# Patient Record
Sex: Female | Born: 1975 | Race: White | Hispanic: No | Marital: Married | Smoking: Former smoker
Health system: Southern US, Community
[De-identification: ages and names within clinical notes are randomized; demographics above are authoritative.]

## PROBLEM LIST (undated history)

## (undated) DIAGNOSIS — K921 Melena: Secondary | ICD-10-CM

## (undated) DIAGNOSIS — R51 Headache: Secondary | ICD-10-CM

## (undated) DIAGNOSIS — J302 Other seasonal allergic rhinitis: Secondary | ICD-10-CM

## (undated) DIAGNOSIS — E669 Obesity, unspecified: Secondary | ICD-10-CM

## (undated) DIAGNOSIS — D649 Anemia, unspecified: Secondary | ICD-10-CM

## (undated) DIAGNOSIS — B019 Varicella without complication: Secondary | ICD-10-CM

## (undated) DIAGNOSIS — S92919A Unspecified fracture of unspecified toe(s), initial encounter for closed fracture: Secondary | ICD-10-CM

## (undated) DIAGNOSIS — I10 Essential (primary) hypertension: Secondary | ICD-10-CM

## (undated) DIAGNOSIS — K5792 Diverticulitis of intestine, part unspecified, without perforation or abscess without bleeding: Secondary | ICD-10-CM

## (undated) DIAGNOSIS — K635 Polyp of colon: Secondary | ICD-10-CM

## (undated) HISTORY — DX: Diverticulitis of intestine, part unspecified, without perforation or abscess without bleeding: K57.92

## (undated) HISTORY — DX: Melena: K92.1

## (undated) HISTORY — DX: Varicella without complication: B01.9

## (undated) HISTORY — DX: Obesity, unspecified: E66.9

## (undated) HISTORY — DX: Polyp of colon: K63.5

## (undated) HISTORY — DX: Anemia, unspecified: D64.9

## (undated) HISTORY — DX: Essential (primary) hypertension: I10

---

## 2010-12-04 ENCOUNTER — Ambulatory Visit: Payer: Self-pay | Admitting: Family Medicine

## 2010-12-04 ENCOUNTER — Encounter: Payer: Self-pay | Admitting: Family Medicine

## 2010-12-04 ENCOUNTER — Ambulatory Visit (HOSPITAL_BASED_OUTPATIENT_CLINIC_OR_DEPARTMENT_OTHER)
Admission: RE | Admit: 2010-12-04 | Discharge: 2010-12-04 | Payer: Self-pay | Source: Home / Self Care | Attending: Family Medicine | Admitting: Family Medicine

## 2010-12-04 DIAGNOSIS — R109 Unspecified abdominal pain: Secondary | ICD-10-CM | POA: Insufficient documentation

## 2010-12-04 LAB — CONVERTED CEMR LAB
Bilirubin Urine: NEGATIVE
Glucose, Urine, Semiquant: NEGATIVE
Ketones, urine, test strip: NEGATIVE
Nitrite: NEGATIVE
Protein, U semiquant: NEGATIVE
Specific Gravity, Urine: 1.015
Urobilinogen, UA: 0.2
WBC Urine, dipstick: NEGATIVE
pH: 6

## 2010-12-08 ENCOUNTER — Emergency Department (HOSPITAL_BASED_OUTPATIENT_CLINIC_OR_DEPARTMENT_OTHER)
Admission: EM | Admit: 2010-12-08 | Discharge: 2010-12-08 | Payer: Self-pay | Source: Home / Self Care | Admitting: Emergency Medicine

## 2010-12-08 ENCOUNTER — Telehealth (INDEPENDENT_AMBULATORY_CARE_PROVIDER_SITE_OTHER): Payer: Self-pay | Admitting: *Deleted

## 2010-12-08 LAB — CONVERTED CEMR LAB
ALT: 19 units/L (ref 0–35)
AST: 16 units/L (ref 0–37)
Albumin: 4.6 g/dL (ref 3.5–5.2)
Alkaline Phosphatase: 68 units/L (ref 39–117)
BUN: 9 mg/dL (ref 6–23)
Basophils Absolute: 0 10*3/uL (ref 0.0–0.1)
Basophils Relative: 0 % (ref 0–1)
Bilirubin, Direct: 0.1 mg/dL (ref 0.0–0.3)
CO2: 23 meq/L (ref 19–32)
Calcium: 9.1 mg/dL (ref 8.4–10.5)
Chloride: 104 meq/L (ref 96–112)
Creatinine, Ser: 0.77 mg/dL (ref 0.40–1.20)
Eosinophils Absolute: 0.1 10*3/uL (ref 0.0–0.7)
Eosinophils Relative: 1 % (ref 0–5)
Glucose, Bld: 81 mg/dL (ref 70–99)
HCT: 39.5 % (ref 36.0–46.0)
Hemoglobin: 13 g/dL (ref 12.0–15.0)
Indirect Bilirubin: 0.5 mg/dL (ref 0.0–0.9)
Lymphocytes Relative: 18 % (ref 12–46)
Lymphs Abs: 2.6 10*3/uL (ref 0.7–4.0)
MCHC: 32.9 g/dL (ref 30.0–36.0)
MCV: 82 fL (ref 78.0–100.0)
Monocytes Absolute: 0.7 10*3/uL (ref 0.1–1.0)
Monocytes Relative: 5 % (ref 3–12)
Neutro Abs: 11 10*3/uL — ABNORMAL HIGH (ref 1.7–7.7)
Neutrophils Relative %: 76 % (ref 43–77)
Platelets: 284 10*3/uL (ref 150–400)
Potassium: 3.7 meq/L (ref 3.5–5.3)
RBC: 4.82 M/uL (ref 3.87–5.11)
RDW: 13.6 % (ref 11.5–15.5)
Sed Rate: 7 mm/hr (ref 0–22)
Sodium: 140 meq/L (ref 135–145)
Total Bilirubin: 0.6 mg/dL (ref 0.3–1.2)
Total Protein: 7.1 g/dL (ref 6.0–8.3)
WBC: 14.4 10*3/uL — ABNORMAL HIGH (ref 4.0–10.5)

## 2010-12-13 HISTORY — PX: LAPAROSCOPIC CHOLECYSTECTOMY: SUR755

## 2010-12-15 ENCOUNTER — Telehealth: Payer: Self-pay | Admitting: Family Medicine

## 2010-12-15 ENCOUNTER — Emergency Department (HOSPITAL_BASED_OUTPATIENT_CLINIC_OR_DEPARTMENT_OTHER)
Admission: EM | Admit: 2010-12-15 | Discharge: 2010-12-15 | Payer: Self-pay | Source: Home / Self Care | Admitting: Emergency Medicine

## 2010-12-16 ENCOUNTER — Encounter: Payer: Self-pay | Admitting: Family Medicine

## 2010-12-16 ENCOUNTER — Ambulatory Visit: Admit: 2010-12-16 | Payer: Self-pay | Admitting: Family Medicine

## 2010-12-18 ENCOUNTER — Encounter (INDEPENDENT_AMBULATORY_CARE_PROVIDER_SITE_OTHER): Payer: Self-pay | Admitting: *Deleted

## 2010-12-18 ENCOUNTER — Encounter: Payer: Self-pay | Admitting: Family Medicine

## 2010-12-23 LAB — HM COLONOSCOPY

## 2011-01-14 ENCOUNTER — Telehealth: Payer: Self-pay | Admitting: Internal Medicine

## 2011-01-14 NOTE — Telephone Encounter (Signed)
fine

## 2011-01-14 NOTE — Progress Notes (Signed)
Summary: still no better  Phone Note Call from Patient Call back at [phone]   Caller: Patient Summary of Call: Pt still c/o dizziness, nausea with some abdominal pain and diarrhea follow with meals. Pt states that she only able to drink liquid due to all food causing extreme abdominal pain and diarrhea. Pt has pending OV with you tomorrow but would like to know what she can do in mean time. Pt advise UC/ED but Pt refused stating that she does not want to continue going to UC/ED. Pls advise.........Marland KitchenFelecia Deloach CMA  December 15, 2010 12:56 PM   Follow-up for Phone Call        she may need hospital admission given her abd pain and diarrhea.  i would recommend she go to ER b/c when we last spoke w/ her she was feeling better on liquid diet.  needs GI appt ASAP- has appt pending w/ [facility] but not until 1/23.  please see if this can be moved up. Follow-up by: Neena Rhymes MD,  December 15, 2010 1:02 PM  Additional Follow-up for Phone Call Additional follow up Details #1::        Pt aware but still reluctant to go to ED. Pt states she will see how she feels and if it gets any worse she will go to ED otherwise she will be in tomorrow for OV. Pt also aware that we are working to get a sooner appt...Marland KitchenMarland KitchenFelecia Deloach CMA  December 15, 2010 4:50 PM     Additional Follow-up for Phone Call Additional follow up Details #2::    noted. Follow-up by: Neena Rhymes MD,  December 15, 2010 4:53 PM

## 2011-01-14 NOTE — Telephone Encounter (Signed)
I rcvd a call from this pt. She is currently a pt of Neena Rhymes, MD at [facility]. Pt is interesting in transferring to our practice, since it is closer to her work. Pls advise.

## 2011-01-14 NOTE — Assessment & Plan Note (Signed)
Summary: NEW EST- STOMACH PAIN    Vital Signs:  Patient profile:   35 year old female Height:      66.50 inches Weight:      199 pounds BMI:     31.75 O2 Sat:      98 % on Room air Temp:     98.5 degrees F oral Pulse rate:   88 / minute BP sitting:   130 / 80  (left arm)  Vitals Entered By: Doristine Devoid CMA (December 04, 2010 1:17 PM)  O2 Flow:  Room air CC: stomach pain along w/ NVD, RLQ and back pain along w/ bloody stool    History of Present Illness: 35 yo woman here today to establish care.  previous MD- [facility] in [place].  abd pain- sxs started in Sept.  alternating constipation and diarrhea.  diarrhea will occur in the afternoon and 'last for hours'.  over the last few days pt has developed HA, severe GERD, diarrhea has increased in frequency (6 episodes today, almost pure water w/ blood).  sharp pain in R flank.  + nausea, no vomiting.  not eating or drinking due to fear of vomiting.  + anorexia.  Preventive Screening-Counseling & Management  Alcohol-Tobacco     Alcohol drinks/day: <1     Smoking Status: never      Drug Use:  never.    Current Medications (verified): 1)  Diovan Hct .... Dose Unknown- Takes Daily  Allergies (verified): No Known Drug Allergies  Past History:  Past Medical History: Hypertension  Past Surgical History: Caesarean section  Family History: CAD-no HTN-no DM-no STROKE-grandfather COLON CA-no BREAST CA-no CROHN'S-father LUPUS-father  Social History: twin girls married works at [employer] Status:  never Drug Use:  never  Review of Systems       The patient complains of anorexia, headaches, abdominal pain, hematochezia, severe indigestion/heartburn, and muscle weakness.  The patient denies fever, weight loss, weight gain, syncope, melena, and hematuria.    Physical Exam  General:  pale, tired appearing Head:  NCAT Mouth:  MMM Neck:  No deformities, masses, or tenderness noted. Lungs:   Normal respiratory effort, chest expands symmetrically. Lungs are clear to auscultation, no crackles or wheezes. Heart:  reg S1/S2, no M/R/G Abdomen:  soft, + BSx4, nondistended, + RLQ pain and R CVA tenderness.  no rebound or guarding Rectal:  external hemorrhoid, heme (-) stool Pulses:  +2 radial, DP/PT Extremities:  no C/C/E   Impression & Recommendations:  Problem # 1:  ABDOMINAL PAIN (ICD-789.00) Assessment New pt's differential dx is broad- appendicitis, kidney stone, diverticulitis, Crohn's.  given acute worsening of ongoing process will need CT scan.  draw labs when IV is placed for CT.  start phenergan as needed for nausea.  refer to GI.  determine rest of plan based on CT results.  CT shows diffuse colitis- can't r/o C diff or Crohn's.  start Flagyl.  discussed w/ pt at CT, instructions reviewed multiple times and told pt to f/u at ER if sxs worsen or persist.  Pt expresses understanding and is in agreement w/ this plan. Orders: Venipuncture (04540) TLB-BMP (Basic Metabolic Panel-BMET) (80048-METABOL) TLB-CBC Platelet - w/Differential (85025-CBCD) TLB-Hepatic/Liver Function Pnl (80076-HEPATIC) TLB-Sedimentation Rate (ESR) (85652-ESR) Radiology Referral (Radiology) Gastroenterology Referral (GI) UA Dipstick w/o Micro (manual) (98119)  Complete Medication List: 1)  Diovan Hct  .... Dose unknown- takes daily 2)  Promethazine Hcl 25 Mg Tabs (Promethazine hcl) .Marland Kitchen.. 1 tab by mouth q6 as needed for nausea  3)  Metronidazole 500 Mg Tabs (Metronidazole) .Marland Kitchen.. 1 tab by mouth three times a day x10 days.  take w/ food.  Patient Instructions: 1)  We'll notify you of your lab results 2)  Go to [facility] on [address] for your [address] scan- we'll notify you of your results and decide on additional treatment at that time 3)  Take the Promethazine as needed for nausea 4)  Call with any questions or concerns 5)  If your symptoms worsen- please go to the ER 6)  Hang in there! 7)  Happy  Holidays! Prescriptions: METRONIDAZOLE 500 MG  TABS (METRONIDAZOLE) 1 tab by mouth three times a day x10 days.  take w/ food.  #30 x 0   Entered and Authorized by:   Neena Rhymes MD   Signed by:   Neena Rhymes MD on 12/04/2010   Method used:   Electronically to        [facility] [address] (retail)       [address]       [place], [place]  [address]       Ph: [phone]       Fax: [phone]   RxID:   [id number] PROMETHAZINE HCL 25 MG  TABS (PROMETHAZINE HCL) 1 tab by mouth Q6 as needed for nausea  #30 x 0   Entered and Authorized by:   Neena Rhymes MD   Signed by:   Neena Rhymes MD on 12/04/2010   Method used:   Print then Give to Patient   RxID:   [id number]    Orders Added: 1)  Venipuncture [23762] 2)  TLB-BMP (Basic Metabolic Panel-BMET) [80048-METABOL] 3)  TLB-CBC Platelet - w/Differential [85025-CBCD] 4)  TLB-Hepatic/Liver Function Pnl [80076-HEPATIC] 5)  TLB-Sedimentation Rate (ESR) [85652-ESR] 6)  Radiology Referral [Radiology] 7)  Gastroenterology Referral [GI] 8)  New Patient Level III [99203] 9)  UA Dipstick w/o Micro (manual) [81002]    Laboratory Results   Urine Tests    Routine Urinalysis   Glucose: negative   (Normal Range: Negative) Bilirubin: negative   (Normal Range: Negative) Ketone: negative   (Normal Range: Negative) Spec. Gravity: 1.015   (Normal Range: 1.003-1.035) Blood: small   (Normal Range: Negative) pH: 6.0   (Normal Range: 5.0-8.0) Protein: negative   (Normal Range: Negative) Urobilinogen: 0.2   (Normal Range: 0-1) Nitrite: negative   (Normal Range: Negative) Leukocyte Esterace: negative   (Normal Range: Negative)

## 2011-01-14 NOTE — Progress Notes (Signed)
Summary: Still having Discomfort  Phone Note Call from Patient Call back at [phone] cell   Caller: Patient Call For: Neena Rhymes MD Details for Reason: Still in pain Summary of Call: Call from patient and she stated she was still in pain with NVD, stated she was burping alot and does not feel like the meds are working.Marland KitchenMarland KitchenMarland KitchenI advised the patient we were working on the referral, advised continue to drink fluids and if the pain or any symptoms above get worst go to the hospt. Pt voiced understanding. Advised Renee of the call and she stated she would work on getting the patient and appt today. C/b # [id number]...Marland KitchenMarland KitchenMarland Kitchen Almeta Monas CMA Duncan Dull)  December 08, 2010 8:28 AM

## 2011-01-14 NOTE — Progress Notes (Signed)
Summary: GI REFERRAL---ER call-check on pt tomorrow  Phone Note Outgoing Call   Call placed by: Magdalen Spatz [facility],  December 08, 2010 8:51 AM Call placed to: Specialist Summary of Call: I CALLED [facility] TO SCH'D PT FOR ASAP APPT.  THE TRIAGE NURSE TOOK DOWN PATIENT SYMPTOMS, LABS RESULTS, CT RESULTS & SPOKE WITH GI PHSYICIAN WHO STATES PATIENT SHOULD REPORT TO HER LOCAL ER FOR POSSIBLE ADMISSION.  I HAVE CALLED & INFORMED PT OF ABOVE, PATIENT AGREES TO ER & STATES SHE WILL GO TO [facility] ER IN [place].  Initial call taken by: Magdalen Spatz [facility],  December 08, 2010 8:51 AM  Follow-up for Phone Call        patient went to the ER, the ER physician Dr. Rosalia Hammers called  me. Pt  was a slightly bloated, slightly dehydrated, got  IV fluids.  All labs from today  came back normal, WBC 8.0. Plain x-ray showed no obstruction. Is  the ER doctor opinion that she can go home and the patient feels the same way. plan---- Check on her tomorrow, visit with one of [place] this week if no better. continue with Flagyl ( since she is getting better, we decided not to add Cipro) Be sure that she has appropriate GI followup within the next few days Jose E. Paz MD  December 08, 2010 1:21 PM   Additional Follow-up for Phone Call Additional follow up Details #1::        agree.  will call pt 12/28 and see how pt is feeling. Additional Follow-up by: Neena Rhymes MD,  December 08, 2010 3:26 PM    Additional Follow-up for Phone Call Additional follow up Details #2::    PATIENT'S APPOINTMENT IS 01-04-2011 ARRIVE 10:45AM W/PA-JANE LANGFITT OF [facility] (1ST AVAIL).  I S/W PATIENT SHE IS AWARE AND FINE WITH THE DATE, BUT IS THIS DATE OK WITH DR. TABORI? Magdalen Spatz [facility]  December 09, 2010 9:56 AM  spoke w/ patient says she is feeling much better and has been on liquid diet also still taking flaygl has appt w/ GI but will call if symptoms worsen to see if we can get sooner appt. .......Marland KitchenDoristine Devoid CMA   December 09, 2010 10:45 AM   New/Updated Medications: DIOVAN HCT 80-12.5 MG TABS (VALSARTAN-HYDROCHLOROTHIAZIDE) take one tablet daily And

## 2011-01-14 NOTE — Consult Note (Signed)
Summary: [facility]  [facility]   Imported By: Lanelle Bal 12/25/2010 13:45:51  _____________________________________________________________________  External Attachment:    Type:   Image     Comment:   External Document

## 2011-01-14 NOTE — Miscellaneous (Signed)
  Clinical Lists Changes  Observations: Added new observation of COLONRECACT: few scattered diverticula 1 sessile polyp removed 5-10 years pending path (12/18/2009 16:35)        Colonoscopy  Procedure date:  12/18/2009  Comments:      few scattered diverticula 1 sessile polyp removed 5-10 years pending path

## 2011-01-14 NOTE — Procedures (Signed)
Summary: Colonoscopy/[facility]  Colonoscopy/[facility]   Imported By: Lanelle Bal 12/25/2010 13:44:21  _____________________________________________________________________  External Attachment:    Type:   Image     Comment:   External Document

## 2011-01-14 NOTE — Telephone Encounter (Signed)
----   01/14/2011 1:57 PM, Neena Rhymes MD wrote: this would be fine

## 2011-01-20 ENCOUNTER — Ambulatory Visit (INDEPENDENT_AMBULATORY_CARE_PROVIDER_SITE_OTHER): Payer: BC Managed Care – PPO | Admitting: Family Medicine

## 2011-01-20 ENCOUNTER — Encounter: Payer: Self-pay | Admitting: Internal Medicine

## 2011-01-20 ENCOUNTER — Encounter: Payer: Self-pay | Admitting: Family Medicine

## 2011-01-20 VITALS — BP 130/80 | HR 80 | Temp 98.6°F | Resp 12 | Ht 65.25 in | Wt 194.0 lb

## 2011-01-20 DIAGNOSIS — J029 Acute pharyngitis, unspecified: Secondary | ICD-10-CM

## 2011-01-20 LAB — POCT RAPID STREP A (OFFICE): Rapid Strep A Screen: NEGATIVE

## 2011-01-20 NOTE — Progress Notes (Signed)
  Subjective:    Patient ID: [name], female    DOB: 1976/08/24, 35 y.o.   MRN: [id number]  HPI  New patient seen with acute issue of onset couple days ago sore throat. Daughter just diagnosed with strep throat yesterday. Patient initially developed sore throat followed by some nasal congestion rare cough. No fever but occasional mild chills. Denies any nausea, vomiting, diarrhea , or any rash. Has taken Tylenol with mild improvement.    patient scheduled for complete physical this Friday with Dr. Rodena Medin.   She relates about 3 week history of some daily constant right temporal headache. Mild to moderate severity. No visual changes. Sometimes worse after stooping over. No progressive pattern. No focal neurologic symptoms. No recent head injury.   Her past medical history is reviewed. She has reported history of diverticulitis , remote history of anemia comment benign colon polyps, and hypertension. Takes Diovan HCTZ 80/12.5 mg one daily. Had recent colonoscopy last month. Nonsmoker. Rare alcohol use.   Review of Systems  as per history of present illness    Objective:   Physical Exam  patient is alert and healthy in appearance  Oropharynx reveals a couple small  Abscess ulcers. No exudate.  neck supple with no adenopathy  Chest is clear to auscultation  Heart regular rhythm and rate with no murmur  Skin exam no rash       Assessment & Plan:   probable viral pharyngitis. Rapid strep negative. Treat symptomatically. Atypical headaches.  She will discuss with Dr Rodena Medin at [facility] Friday.

## 2011-01-20 NOTE — Patient Instructions (Signed)
Viral Pharyngitis  Viral pharyngitis is a sore throat caused by a cold virus. This is not a strep throat. This does not require treatment by an antibiotic (medications that kill germs). It will get better on its own. Antibiotics generally will not help unless this condition is followed by a bacterial (germ) infection.  HOME CARE INSTRUCTIONS   Drink or give your child plenty of fluids. Soft, cold foods such as ice cream, popsicles, or jello may be used.    Gargle with warm salt water (one teaspoon salt per quart of water).    If over age 7, throat lozenges may be used safely.    Only take over-the-counter or prescription medicines for pain, discomfort, or fever as directed by your caregiver. DO NOT USE ASPIRIN.   SEEK MEDICAL CARE IF:   You are better in a few days, then become worse.    You have a fever or pain unresponsive to pain medicines.    There are any other changes that concern you.   Diagnostic tests may have been performed. If you have not received results at time of discharge, receive instructions as how to obtain these results.   Document Released: 09/08/2005 Document Re-Released: 05/17/2008  Exit[employer] Patient Information 2011 [employer].

## 2011-01-22 ENCOUNTER — Encounter: Payer: Self-pay | Admitting: Internal Medicine

## 2011-01-22 ENCOUNTER — Ambulatory Visit (INDEPENDENT_AMBULATORY_CARE_PROVIDER_SITE_OTHER): Payer: BC Managed Care – PPO | Admitting: Internal Medicine

## 2011-01-22 DIAGNOSIS — Z13220 Encounter for screening for lipoid disorders: Secondary | ICD-10-CM

## 2011-01-22 DIAGNOSIS — Z79899 Other long term (current) drug therapy: Secondary | ICD-10-CM

## 2011-01-22 DIAGNOSIS — E785 Hyperlipidemia, unspecified: Secondary | ICD-10-CM

## 2011-01-22 DIAGNOSIS — R51 Headache: Secondary | ICD-10-CM

## 2011-01-22 DIAGNOSIS — I10 Essential (primary) hypertension: Secondary | ICD-10-CM

## 2011-01-22 DIAGNOSIS — R109 Unspecified abdominal pain: Secondary | ICD-10-CM

## 2011-01-22 LAB — LDL CHOLESTEROL, DIRECT: Direct LDL: 61.7 mg/dL

## 2011-01-22 LAB — BASIC METABOLIC PANEL
BUN: 10 mg/dL (ref 6–23)
CO2: 29 mEq/L (ref 19–32)
Calcium: 9.1 mg/dL (ref 8.4–10.5)
Chloride: 105 mEq/L (ref 96–112)
Creatinine, Ser: 0.7 mg/dL (ref 0.4–1.2)
GFR: 110.78 mL/min (ref >60.00)
Glucose, Bld: 86 mg/dL (ref 70–99)
Potassium: 3.9 mEq/L (ref 3.5–5.1)
Sodium: 140 mEq/L (ref 135–145)

## 2011-01-22 LAB — CBC WITH DIFFERENTIAL/PLATELET
Basophils Absolute: 0 10*3/uL (ref 0.0–0.1)
Basophils Relative: 0.6 % (ref 0.0–3.0)
Eosinophils Absolute: 0.1 10*3/uL (ref 0.0–0.7)
Eosinophils Relative: 2 % (ref 0.0–5.0)
HCT: 37.3 % (ref 36.0–46.0)
Hemoglobin: 12.8 g/dL (ref 12.0–15.0)
Lymphocytes Relative: 37.5 % (ref 12.0–46.0)
Lymphs Abs: 2.2 10*3/uL (ref 0.7–4.0)
MCHC: 34.4 g/dL (ref 30.0–36.0)
MCV: 81.7 fl (ref 78.0–100.0)
Monocytes Absolute: 0.4 10*3/uL (ref 0.1–1.0)
Monocytes Relative: 6.8 % (ref 3.0–12.0)
Neutro Abs: 3.2 10*3/uL (ref 1.4–7.7)
Neutrophils Relative %: 53.1 % (ref 43.0–77.0)
Platelets: 285 10*3/uL (ref 150.0–400.0)
RBC: 4.57 Mil/uL (ref 3.87–5.11)
RDW: 14.4 % (ref 11.5–14.6)
WBC: 6 10*3/uL (ref 4.5–10.5)

## 2011-01-22 LAB — LIPID PANEL
Cholesterol: 162 mg/dL (ref 0–200)
HDL: 29.3 mg/dL — ABNORMAL LOW (ref >39.00)
Total CHOL/HDL Ratio: 6
Triglycerides: 419 mg/dL — ABNORMAL HIGH (ref 0.0–149.0)
VLDL: 83.8 mg/dL — ABNORMAL HIGH (ref 0.0–40.0)

## 2011-01-22 LAB — TSH: TSH: 1.68 u[IU]/mL (ref 0.35–5.50)

## 2011-01-22 MED ORDER — VALSARTAN-HYDROCHLOROTHIAZIDE 80-12.5 MG PO TABS: 1.0000 | ORAL_TABLET | Freq: Every day | ORAL | Status: DC

## 2011-01-22 NOTE — Progress Notes (Signed)
  Subjective:    Patient ID: [name], female    DOB: January 24, 1976, 35 y.o.   MRN: [id number]  HPI Pt presents to clinic to re-establish primary care and followup HTN. Chart review indicates h/o abd pain, diarrhea s/p CT demonstrating diffuse inflammation. Attempte flagyl with no improvement. Underwent colonoscopy 12/18/10 with single polyp and diverticuli. Was told random bx's nl. Placed on prednisone, changed her diet and states her sx resolved. Does note recent 3wk h/o right temple HA described as dull with intermittent throbbing sensation. Denies numbness, tingling, extremity weakness. Has significant spontaneous improvement over the last several days and wonders if HA was related to recent medications an diet changes. H/o HTN apparently well controlled and tolerates diovan hct without adverse effect. Sees gyn for routine paps and has IUD in place.   Reviewed PMH, medications, allergies, social hx and family hx.    Review of Systems  Constitutional: Negative for fever and chills.  HENT: Positive for congestion and rhinorrhea.   Eyes: Negative for discharge and redness.  Neurological: Positive for headaches. Negative for speech difficulty, weakness and numbness.       Objective:   Physical Exam  Constitutional: She appears well-developed and well-nourished. No distress.  HENT:  Head: Normocephalic.  Right Ear: Tympanic membrane and external ear normal.  Left Ear: Tympanic membrane and external ear normal.  Nose: Nose normal.  Mouth/Throat: Oropharynx is clear and moist.  Eyes: Conjunctivae and EOM are normal. Pupils are equal, round, and reactive to light. Right eye exhibits no discharge. Left eye exhibits no discharge. No scleral icterus.  Neck: Neck supple. No thyromegaly present.  Cardiovascular: Normal rate, regular rhythm and normal heart sounds.  Exam reveals no gallop and no friction rub.   No murmur heard. Pulmonary/Chest: Effort normal and breath sounds normal. No  respiratory distress. She has no wheezes. She has no rales.  Abdominal: Soft. Bowel sounds are normal. She exhibits no distension and no mass. There is no tenderness.  Lymphadenopathy:    She has no cervical adenopathy.  Neurological: She is alert. No cranial nerve deficit. Coordination normal.  Skin: Skin is dry. No rash noted. She is not diaphoretic. No erythema.  Psychiatric: She has a normal mood and affect.          Assessment & Plan:

## 2011-01-26 DIAGNOSIS — R51 Headache: Secondary | ICD-10-CM | POA: Insufficient documentation

## 2011-01-26 DIAGNOSIS — R519 Headache, unspecified: Secondary | ICD-10-CM | POA: Insufficient documentation

## 2011-01-26 NOTE — Assessment & Plan Note (Signed)
Clinically improving and pt feels likely due to finishing medication and change in diet. Followup if no resolution.

## 2011-01-26 NOTE — Assessment & Plan Note (Signed)
Resolved s/p prednisone and dietary changes.

## 2011-01-26 NOTE — Assessment & Plan Note (Signed)
Normotensive control. Continue current medication. Monitor BP as an outpt and followup in clinic as scheduled. Obtain CBC, chem7, lipid and TSH.

## 2011-01-27 ENCOUNTER — Other Ambulatory Visit: Payer: Self-pay | Admitting: Internal Medicine

## 2011-01-27 ENCOUNTER — Telehealth: Payer: Self-pay

## 2011-01-27 DIAGNOSIS — E785 Hyperlipidemia, unspecified: Secondary | ICD-10-CM

## 2011-01-27 NOTE — Telephone Encounter (Signed)
Message copied by Kyung Rudd on Wed Jan 27, 2011  2:44 PM ------      Message from: Letitia Libra, Maisie Fus      Created: Wed Jan 27, 2011  1:28 PM       Labs reviewed. tg elevated 419 and hdl low. Recommend low fat diet, regular aerobic exercise at least 4x/wk. Suggest otc fish oil daily 2 capsules. Can repeat lipid fasting in 3 months. Order in

## 2011-01-27 NOTE — Telephone Encounter (Signed)
Pt aware and verbalized understanding.  

## 2011-01-29 ENCOUNTER — Telehealth: Payer: Self-pay | Admitting: *Deleted

## 2011-01-29 ENCOUNTER — Telehealth: Payer: Self-pay

## 2011-01-29 NOTE — Telephone Encounter (Signed)
Pt. Would like to be referred to a nutrionist.

## 2011-01-29 NOTE — Telephone Encounter (Signed)
Pt would like the referral to the Nutrionist.

## 2011-01-29 NOTE — Telephone Encounter (Signed)
Can make the referral but need a reason and caution her that insurance doesn't always cover nutrition referrals

## 2011-01-29 NOTE — Telephone Encounter (Signed)
Pt wants to talk to someone that can help her understand what foods are good for her. Says she thinks her insurance covers nutritionist for up to 2 yrs but will check to be sure.

## 2011-02-01 ENCOUNTER — Other Ambulatory Visit: Payer: Self-pay | Admitting: Internal Medicine

## 2011-02-01 DIAGNOSIS — I10 Essential (primary) hypertension: Secondary | ICD-10-CM

## 2011-02-04 ENCOUNTER — Other Ambulatory Visit (HOSPITAL_COMMUNITY): Payer: Self-pay | Admitting: Gastroenterology

## 2011-02-04 ENCOUNTER — Encounter: Payer: Self-pay | Admitting: Family Medicine

## 2011-02-04 DIAGNOSIS — R110 Nausea: Secondary | ICD-10-CM

## 2011-02-17 ENCOUNTER — Ambulatory Visit (HOSPITAL_COMMUNITY)
Admission: RE | Admit: 2011-02-17 | Discharge: 2011-02-17 | Disposition: A | Discharge status: Home / Self Care | Payer: BC Managed Care – PPO | Source: Ambulatory Visit | Attending: Gastroenterology | Admitting: Gastroenterology

## 2011-02-17 DIAGNOSIS — R109 Unspecified abdominal pain: Secondary | ICD-10-CM | POA: Insufficient documentation

## 2011-02-17 DIAGNOSIS — R110 Nausea: Secondary | ICD-10-CM | POA: Insufficient documentation

## 2011-02-17 MED ORDER — TECHNETIUM TC 99M MEBROFENIN IV KIT
5.0000 | PACK | Freq: Once | INTRAVENOUS | Status: AC | PRN
  Administered 2011-02-17: 5 via INTRAVENOUS

## 2011-02-19 ENCOUNTER — Ambulatory Visit (HOSPITAL_COMMUNITY)
Admission: RE | Admit: 2011-02-19 | Discharge: 2011-02-19 | Disposition: A | Discharge status: Home / Self Care | Payer: BC Managed Care – PPO | Source: Ambulatory Visit | Attending: General Surgery | Admitting: General Surgery

## 2011-02-19 ENCOUNTER — Other Ambulatory Visit: Payer: Self-pay | Admitting: General Surgery

## 2011-02-19 ENCOUNTER — Other Ambulatory Visit (HOSPITAL_COMMUNITY): Payer: Self-pay | Admitting: General Surgery

## 2011-02-19 ENCOUNTER — Encounter (HOSPITAL_COMMUNITY): Payer: BC Managed Care – PPO

## 2011-02-19 DIAGNOSIS — K828 Other specified diseases of gallbladder: Secondary | ICD-10-CM | POA: Insufficient documentation

## 2011-02-19 DIAGNOSIS — I10 Essential (primary) hypertension: Secondary | ICD-10-CM | POA: Insufficient documentation

## 2011-02-19 DIAGNOSIS — Z01812 Encounter for preprocedural laboratory examination: Secondary | ICD-10-CM | POA: Insufficient documentation

## 2011-02-19 DIAGNOSIS — Z01810 Encounter for preprocedural cardiovascular examination: Secondary | ICD-10-CM | POA: Insufficient documentation

## 2011-02-19 DIAGNOSIS — Z01818 Encounter for other preprocedural examination: Secondary | ICD-10-CM | POA: Insufficient documentation

## 2011-02-19 LAB — CBC
HCT: 41.4 % (ref 36.0–46.0)
Hemoglobin: 13.3 g/dL (ref 12.0–15.0)
MCH: 26.5 pg (ref 26.0–34.0)
MCHC: 32.1 g/dL (ref 30.0–36.0)
MCV: 82.5 fL (ref 78.0–100.0)
Platelets: 270 10*3/uL (ref 150–400)
RBC: 5.02 MIL/uL (ref 3.87–5.11)
RDW: 13.9 % (ref 11.5–15.5)
WBC: 8.8 10*3/uL (ref 4.0–10.5)

## 2011-02-19 LAB — COMPREHENSIVE METABOLIC PANEL
Alkaline Phosphatase: 67 U/L (ref 39–117)
BUN: 11 mg/dL (ref 6–23)
CO2: 27 mEq/L (ref 19–32)
Chloride: 102 mEq/L (ref 96–112)
Creatinine, Ser: 0.8 mg/dL (ref 0.4–1.2)
GFR calc non Af Amer: >60 mL/min (ref >60)
Glucose, Bld: 84 mg/dL (ref 70–99)
Potassium: 3.5 mEq/L (ref 3.5–5.1)
Total Bilirubin: 0.4 mg/dL (ref 0.3–1.2)

## 2011-02-19 LAB — DIFFERENTIAL
Basophils Absolute: 0.1 10*3/uL (ref 0.0–0.1)
Basophils Relative: 1 % (ref 0–1)
Eosinophils Absolute: 0.2 10*3/uL (ref 0.0–0.7)
Eosinophils Relative: 2 % (ref 0–5)
Lymphocytes Relative: 42 % (ref 12–46)
Lymphs Abs: 3.7 10*3/uL (ref 0.7–4.0)
Monocytes Absolute: 0.5 10*3/uL (ref 0.1–1.0)
Monocytes Relative: 5 % (ref 3–12)
Neutro Abs: 4.4 10*3/uL (ref 1.7–7.7)
Neutrophils Relative %: 50 % (ref 43–77)

## 2011-02-19 LAB — HCG, SERUM, QUALITATIVE: Preg, Serum: NEGATIVE

## 2011-02-19 LAB — SURGICAL PCR SCREEN: MRSA, PCR: NEGATIVE

## 2011-02-22 LAB — URINALYSIS, ROUTINE W REFLEX MICROSCOPIC
Bilirubin Urine: NEGATIVE
Bilirubin Urine: NEGATIVE
Glucose, UA: NEGATIVE mg/dL
Hgb urine dipstick: NEGATIVE
Ketones, ur: NEGATIVE mg/dL
Ketones, ur: NEGATIVE mg/dL
Nitrite: NEGATIVE
Nitrite: NEGATIVE
Protein, ur: NEGATIVE mg/dL
Specific Gravity, Urine: 1.005 (ref 1.005–1.030)
Specific Gravity, Urine: 1.011 (ref 1.005–1.030)
Urobilinogen, UA: 0.2 mg/dL (ref 0.0–1.0)
Urobilinogen, UA: 0.2 mg/dL (ref 0.0–1.0)
pH: 6.5 (ref 5.0–8.0)

## 2011-02-22 LAB — CBC
HCT: 39.7 % (ref 36.0–46.0)
Hemoglobin: 12.9 g/dL (ref 12.0–15.0)
Hemoglobin: 13.5 g/dL (ref 12.0–15.0)
MCH: 26.5 pg (ref 26.0–34.0)
MCH: 26.6 pg (ref 26.0–34.0)
MCHC: 34 g/dL (ref 30.0–36.0)
MCV: 78.1 fL (ref 78.0–100.0)
MCV: 78.6 fL (ref 78.0–100.0)
Platelets: 294 10*3/uL (ref 150–400)
Platelets: 312 10*3/uL (ref 150–400)
RBC: 4.87 MIL/uL (ref 3.87–5.11)
RBC: 5.08 MIL/uL (ref 3.87–5.11)
RDW: 13.9 % (ref 11.5–15.5)
WBC: 8.5 10*3/uL (ref 4.0–10.5)
WBC: 8.9 10*3/uL (ref 4.0–10.5)

## 2011-02-22 LAB — COMPREHENSIVE METABOLIC PANEL
ALT: 45 U/L — ABNORMAL HIGH (ref 0–35)
AST: 54 U/L — ABNORMAL HIGH (ref 0–37)
Albumin: 4.5 g/dL (ref 3.5–5.2)
Albumin: 4.9 g/dL (ref 3.5–5.2)
Alkaline Phosphatase: 48 U/L (ref 39–117)
BUN: 10 mg/dL (ref 6–23)
BUN: 9 mg/dL (ref 6–23)
CO2: 25 mEq/L (ref 19–32)
Calcium: 9.3 mg/dL (ref 8.4–10.5)
Calcium: 9.7 mg/dL (ref 8.4–10.5)
Chloride: 102 mEq/L (ref 96–112)
Creatinine, Ser: 0.7 mg/dL (ref 0.4–1.2)
Creatinine, Ser: 0.8 mg/dL (ref 0.4–1.2)
GFR calc Af Amer: >60 mL/min (ref >60)
GFR calc non Af Amer: >60 mL/min (ref >60)
Glucose, Bld: 106 mg/dL — ABNORMAL HIGH (ref 70–99)
Potassium: 4 mEq/L (ref 3.5–5.1)
Potassium: 4.6 mEq/L (ref 3.5–5.1)
Sodium: 141 mEq/L (ref 135–145)
Total Bilirubin: 0.9 mg/dL (ref 0.3–1.2)
Total Protein: 7.6 g/dL (ref 6.0–8.3)
Total Protein: 8.8 g/dL — ABNORMAL HIGH (ref 6.0–8.3)

## 2011-02-22 LAB — URINE MICROSCOPIC-ADD ON

## 2011-02-22 LAB — DIFFERENTIAL
Basophils Absolute: 0.1 10*3/uL (ref 0.0–0.1)
Basophils Relative: 1 % (ref 0–1)
Eosinophils Absolute: 0.3 10*3/uL (ref 0.0–0.7)
Eosinophils Relative: 4 % (ref 0–5)
Lymphocytes Relative: 30 % (ref 12–46)
Lymphocytes Relative: 31 % (ref 12–46)
Lymphs Abs: 2.5 10*3/uL (ref 0.7–4.0)
Lymphs Abs: 2.8 10*3/uL (ref 0.7–4.0)
Monocytes Absolute: 0.6 10*3/uL (ref 0.1–1.0)
Monocytes Absolute: 0.8 10*3/uL (ref 0.1–1.0)
Monocytes Relative: 7 % (ref 3–12)
Monocytes Relative: 8 % (ref 3–12)
Neutro Abs: 5 10*3/uL (ref 1.7–7.7)
Neutro Abs: 5.2 10*3/uL (ref 1.7–7.7)
Neutrophils Relative %: 56 % (ref 43–77)

## 2011-02-22 LAB — PREGNANCY, URINE
Preg Test, Ur: NEGATIVE
Preg Test, Ur: NEGATIVE

## 2011-02-22 LAB — URINE CULTURE
Colony Count: NO GROWTH
Culture  Setup Time: 201201040752
Culture: NO GROWTH

## 2011-02-23 ENCOUNTER — Ambulatory Visit (HOSPITAL_COMMUNITY): Payer: BC Managed Care – PPO

## 2011-02-23 ENCOUNTER — Ambulatory Visit (HOSPITAL_COMMUNITY)
Admission: RE | Admit: 2011-02-23 | Discharge: 2011-02-23 | Disposition: A | Discharge status: Home / Self Care | Payer: BC Managed Care – PPO | Source: Ambulatory Visit | Attending: General Surgery | Admitting: General Surgery

## 2011-02-23 ENCOUNTER — Other Ambulatory Visit: Payer: Self-pay | Admitting: General Surgery

## 2011-02-23 DIAGNOSIS — I10 Essential (primary) hypertension: Secondary | ICD-10-CM | POA: Insufficient documentation

## 2011-02-23 DIAGNOSIS — K811 Chronic cholecystitis: Secondary | ICD-10-CM | POA: Insufficient documentation

## 2011-02-23 DIAGNOSIS — Z6838 Body mass index (BMI) 38.0-38.9, adult: Secondary | ICD-10-CM | POA: Insufficient documentation

## 2011-02-23 DIAGNOSIS — K660 Peritoneal adhesions (postprocedural) (postinfection): Secondary | ICD-10-CM | POA: Insufficient documentation

## 2011-02-23 DIAGNOSIS — F17200 Nicotine dependence, unspecified, uncomplicated: Secondary | ICD-10-CM | POA: Insufficient documentation

## 2011-03-15 NOTE — Op Note (Signed)
[name], [name]               ACCOUNT NO.:  [id number]  MEDICAL RECORD NO.:  [id number]           PATIENT TYPE:  O  LOCATION:  DAYL                         FACILITY:  [facility]  PHYSICIAN:  Anselm Pancoast. Shaivi Rothschild, M.D.DATE OF BIRTH:  21-Mar-1976  DATE OF PROCEDURE:  02/23/2011 DATE OF DISCHARGE:                              OPERATIVE REPORT   REFERRED BY:  Anselmo Rod, MD, [facility]  PREOPERATIVE DIAGNOSIS:  Biliary dyskinesia.  OPERATIONS:  Laparoscopic cholecystectomy, cholangiogram, and also lysis of adhesions below the umbilicus from an old C-section.  ANESTHESIA:  General.  SURGEON:  Anselm Pancoast. Zachery Dakins, MD  ASSISTANT:  Nurse.  HISTORY:  [name] is a 36 year old female who was referred to me by Dr. Charna Elizabeth with symptoms consistent with biliary dyskinesia. She had a pregnancy.  Recently has had episodes of pain that was first diarrhea, then she had a CT that showed some colitis.  The gallbladder looked unremarkable.  She was treated with antibiotics over a period of about 2 to 3 months and her symptoms kind of gradually improved but her pain shifted to the upper abdomen.  She had an ultrasound of the gallbladder, did not show gallstones but then she had a CCK stimulated hepatobiliary scan and this showed a 10% ejection fraction and markedly reproduced her symptoms.  Dr. Loreta Ave referred her to [place] and I saw her in the office on Friday.  She has been on Zofran.  Her C-sections, the last one was in 2006, where she had twins, and she is on blood pressure medicines.  She desires to proceed on with a cholecystectomy and is here for the planned procedure.  We did not repeat lab studies, as they were done on Friday and they were unremarkable.  The patient was taken to the operative suite, given 3 g of Unasyn.  She has got PAS stockings, positioned on the OR table in room 7, and then the abdomen after induction of general anesthesia was prepped with Betadine solution and  draped in a sterile manner.  A small incision was made below the umbilicus.  There is no incision at the umbilicus that I could see, fascia identified down about an inch-and-a half and the adipose tissue picked up between 2 Kochers and a small opening carefully made into the peritoneal cavity.  She has adhesions at the umbilicus and a pursestring suture of 0 Vicryl was placed and Hasson cannula introduced.  The camera was switched and placed in, CO2 was infused and you can see that she has got a distended, kind of tight gallbladder, but I could not see any stones.  The upper 10-mm trocar was placed under direct vision after anesthetized the fascia and two lateral 5-mm trocars were placed at the lateral appropriate positions.  Gallbladder retracted upward and outward.  There were no adhesions in the proximal portion of gallbladder and we made a little opening in the peritoneum at the gallbladder and cystic duct junction, and then I encompassed the right angle or used the right angle to go around the cystic duct at the junction of gallbladder.  I could see the cystic  artery just posterior to this and I freed it up and then I put 2 clips proximal to cystic artery, one distally and then wound clip across the cystic duct and gallbladder junction.  A little opening was made just proximal to this clip.  Cook catheter was introduced.  There was no sludge in the cystic duct and Cook catheter held in place with clip and then a cholangiogram obtained.  There was good prompt filling of the extrahepatic biliary system.  Good flow into the duodenum, about a centimeter and a half in cystic duct.  I then took the catheter out, put 3 clips across the proximal cystic duct, divided it and then divided the cystic artery just distal to the 2 proximal clips.  I then freed up and identified a posterior branch of the cystic artery.  It was likewise doubly clipped proximally and divided and the gallbladder freed  from its bed.  With the gallbladder being tense and manipulation, there was a little spillage of bile but I could not see any stones and then the gallbladder was freed and was placed in the EndoCatch bag.  I reinspected the bed, couple little areas were coagulated for good hemostasis.  I then switched the camera to the upper 10-mm port and switched the little bag that the gallbladder had been placed in, was grabbed and brawled out at the umbilicus.  She has got a dense wall of adhesions just below the umbilicus area, probably at the top part of her C-section incision and I think that some of the bowel was actually flipping around this, as it is real hypertrophied area, and there are some loops of small bowel but there were no small bowel adherent to this area.  I then went ahead and put an additional figure-of-8 at the fascia of the umbilicus, tied them both, then anesthetized the fascia and then using the camera in the upper 10 mm port, divided these adhesions right at the area.  There was good hemostasis.  I did have a little heat on the scissors right on the peritoneum but there appears to be no bleeding, and I do not see any evidence of a hernia in this area at the top part of the incision.  I think by dividing these adhesions that might be given her some of the symptoms and the symptoms that she had described as colitis, probably related somehow now to these adhesions.  The irrigating fluid was aspirated.  I put more __________ in because we had lost a little while to irrigate, everything was clear, and then the upper 10-mm trocar site after other ports had been removed, I did put a figure-of-8 in the fascia at this level, tied it with 0 Vicryl and 4-0 Vicryl on the subcuticular sutures.  The patient tolerated procedure nicely and we will let her decide whether she goes home this afternoon or in the morning __________.     Anselm Pancoast. Zachery Dakins, M.D.     WJW/MEDQ  D:   02/23/2011  T:  02/23/2011  Job:  [id number]  cc:   Anselmo Rod, MD, [facility] Fax: [phone]  Electronically Signed by Consuello Bossier M.D. on 03/15/2011 03:55:40 PM

## 2011-04-30 ENCOUNTER — Other Ambulatory Visit: Payer: BC Managed Care – PPO

## 2011-05-28 ENCOUNTER — Ambulatory Visit (INDEPENDENT_AMBULATORY_CARE_PROVIDER_SITE_OTHER): Payer: BC Managed Care – PPO | Admitting: Internal Medicine

## 2011-05-28 ENCOUNTER — Encounter: Payer: Self-pay | Admitting: Internal Medicine

## 2011-05-28 DIAGNOSIS — H109 Unspecified conjunctivitis: Secondary | ICD-10-CM | POA: Insufficient documentation

## 2011-05-28 MED ORDER — POLYMYXIN B-TRIMETHOPRIM 10000-0.1 UNIT/ML-% OP SOLN: 1.0000 [drp] | OPHTHALMIC | Status: AC

## 2011-05-28 NOTE — Progress Notes (Signed)
  Subjective:    Patient ID: [name], female    DOB: September 21, 1976, 35 y.o.   MRN: [id number]  HPI Pt presents to clinic for evaluation of eye irritation. Notes one day history of left eye discomfort, mucopurulent discharge and erythema. Husband has conjunctivitis and currently being treated with antibiotics. No change in vision no fever or chills. No exacerbating or alleviating factors. No other complaints.  Reviewed past medical history, medications and allergies.  Review of Systems see hpi     Objective:   Physical Exam  Nursing note and vitals reviewed. Constitutional: She appears well-developed and well-nourished. No distress.  HENT:  Head: Normocephalic and atraumatic.  Right Ear: External ear normal.  Left Ear: External ear normal.  Nose: Nose normal.  Eyes: EOM are normal. Right eye exhibits no discharge. Left eye exhibits discharge. Right conjunctiva is not injected. Right conjunctiva has no hemorrhage. Left conjunctiva is injected. Left conjunctiva has no hemorrhage. No scleral icterus.  Neurological: She is alert.  Skin: Skin is warm and dry. She is not diaphoretic.  Psychiatric: She has a normal mood and affect.          Assessment & Plan:

## 2011-05-28 NOTE — Assessment & Plan Note (Signed)
Begin antibiotic drops.Followup if no improvement or worsening.

## 2011-07-16 ENCOUNTER — Other Ambulatory Visit (INDEPENDENT_AMBULATORY_CARE_PROVIDER_SITE_OTHER): Payer: BC Managed Care – PPO

## 2011-07-16 ENCOUNTER — Other Ambulatory Visit: Payer: Self-pay | Admitting: Internal Medicine

## 2011-07-16 DIAGNOSIS — E785 Hyperlipidemia, unspecified: Secondary | ICD-10-CM

## 2011-07-16 LAB — LIPID PANEL
HDL: 31.8 mg/dL — ABNORMAL LOW (ref >39.00)
Total CHOL/HDL Ratio: 5
Triglycerides: 270 mg/dL — ABNORMAL HIGH (ref 0.0–149.0)

## 2011-07-16 LAB — HEPATIC FUNCTION PANEL
Albumin: 4.6 g/dL (ref 3.5–5.2)
Total Bilirubin: 0.7 mg/dL (ref 0.3–1.2)

## 2011-07-16 LAB — LDL CHOLESTEROL, DIRECT: Direct LDL: 87 mg/dL

## 2011-07-26 ENCOUNTER — Ambulatory Visit: Payer: Self-pay | Admitting: Family Medicine

## 2011-07-28 ENCOUNTER — Ambulatory Visit (INDEPENDENT_AMBULATORY_CARE_PROVIDER_SITE_OTHER): Payer: BC Managed Care – PPO | Admitting: Family Medicine

## 2011-07-28 ENCOUNTER — Encounter: Payer: Self-pay | Admitting: Family Medicine

## 2011-07-28 DIAGNOSIS — E785 Hyperlipidemia, unspecified: Secondary | ICD-10-CM

## 2011-07-28 DIAGNOSIS — I10 Essential (primary) hypertension: Secondary | ICD-10-CM

## 2011-07-28 NOTE — Progress Notes (Signed)
  Subjective:    Patient ID: [name], female    DOB: 16-Feb-1976, 35 y.o.   MRN: [id number]  HPI Followup visit for hypertension and dyslipidemia. High triglycerides and low HDL. Patient takes omega-3 supplement 1000 mg daily. Recently started exercise program of running. No weight loss thus far. No recent orthostasis. Blood sugars have been normal. Overall feels better since starting exercise.  Hopes to lose about 30 pounds.   Review of Systems  Constitutional: Negative for fatigue.  Eyes: Negative for visual disturbance.  Respiratory: Negative for cough, chest tightness, shortness of breath and wheezing.   Cardiovascular: Negative for chest pain, palpitations and leg swelling.  Neurological: Negative for dizziness, seizures, syncope, weakness, light-headedness and headaches.       Objective:   Physical Exam  Constitutional: She is oriented to person, place, and time. She appears well-developed and well-nourished.  Neck: Neck supple. No thyromegaly present.  Cardiovascular: Normal rate, regular rhythm and normal heart sounds.   Pulmonary/Chest: Effort normal and breath sounds normal. No respiratory distress. She has no wheezes. She has no rales.  Musculoskeletal: She exhibits no edema.  Lymphadenopathy:    She has no cervical adenopathy.  Neurological: She is alert and oriented to person, place, and time.          Assessment & Plan:  #1 hypertension stable #2 dyslipidemia. Increase omega-3 2-3 g daily. Continue weight loss efforts. Reassess lipids in 6 months

## 2011-07-28 NOTE — Patient Instructions (Signed)
Consider increase omega 3 to 2-3 gms per day. Continue with regular exercise.

## 2011-08-09 ENCOUNTER — Ambulatory Visit (INDEPENDENT_AMBULATORY_CARE_PROVIDER_SITE_OTHER): Payer: BC Managed Care – PPO | Admitting: Family Medicine

## 2011-08-09 ENCOUNTER — Encounter: Payer: Self-pay | Admitting: Family Medicine

## 2011-08-09 ENCOUNTER — Other Ambulatory Visit: Payer: Self-pay | Admitting: Family Medicine

## 2011-08-09 VITALS — BP 130/84 | Temp 98.2°F | Wt 202.0 lb

## 2011-08-09 DIAGNOSIS — R600 Localized edema: Secondary | ICD-10-CM

## 2011-08-09 DIAGNOSIS — R609 Edema, unspecified: Secondary | ICD-10-CM

## 2011-08-09 LAB — POCT URINALYSIS DIPSTICK
Glucose, UA: NEGATIVE
Spec Grav, UA: 1.005
Urobilinogen, UA: 0.2

## 2011-08-09 NOTE — Progress Notes (Signed)
  Subjective:    Patient ID: [name], female    DOB: 07-06-1976, 35 y.o.   MRN: [id number]  HPI Patient seen with edema left leg, ankle and foot. Started last Friday. She is currently running about 30 minutes per day but denies injury. Pain somewhat localized distal fibula but somewhat generalized in the lower leg region. No color changes. No ecchymosis. No foot pain. No erythema or warmth. No history of DVT. Denies any dyspnea. Not able to run over the weekend.  Her pain is greatest lower leg but has some discomfort and swelling of entire lower leg.  Hypertension treated with Diovan HCTZ and stable.   Review of Systems  Constitutional: Negative for fever and chills.  Respiratory: Negative for cough and shortness of breath.   Cardiovascular: Positive for leg swelling. Negative for chest pain and palpitations.  Skin: Negative for rash.  Hematological: Negative for adenopathy. Does not bruise/bleed easily.       Objective:   Physical Exam  Constitutional: She appears well-developed and well-nourished. No distress.  Cardiovascular: Normal rate, regular rhythm and normal heart sounds.   Pulmonary/Chest: Effort normal and breath sounds normal. No respiratory distress. She has no wheezes. She has no rales.  Musculoskeletal: She exhibits edema.       Patient has some edema left foot ankle and leg region. When measuring 20 cm proximal to lateral malleolus she has a circumference of calf 1 cm greater left compared to right. She has some minimal tenderness somewhat poor localized distal fibular region. Full range of motion ankle. normal distal foot pulses. Foot is warm to touch with good capillary refill          Assessment & Plan:  Somewhat asymmetric left ankle foot and leg edema. Fairly low suspicion for DVT but given the asymmetric nature venous Doppler. Check basic metabolic panel and urine dipstick. Consider plain films left ankle if symptoms persist.

## 2011-08-10 ENCOUNTER — Telehealth: Payer: Self-pay | Admitting: *Deleted

## 2011-08-10 ENCOUNTER — Ambulatory Visit
Admission: RE | Admit: 2011-08-10 | Discharge: 2011-08-10 | Disposition: A | Discharge status: Home / Self Care | Payer: BC Managed Care – PPO | Source: Ambulatory Visit | Attending: Family Medicine | Admitting: Family Medicine

## 2011-08-10 DIAGNOSIS — R609 Edema, unspecified: Secondary | ICD-10-CM

## 2011-08-10 LAB — BASIC METABOLIC PANEL
CO2: 23 mEq/L (ref 19–32)
Calcium: 9.2 mg/dL (ref 8.4–10.5)
Chloride: 105 mEq/L (ref 96–112)
Sodium: 137 mEq/L (ref 135–145)

## 2011-08-10 NOTE — Telephone Encounter (Signed)
Spoke with patient about results.  Patient states she is in a lot of pain and it gets worse daily.  She would like something called in if possible.  Karin Golden, [employer].

## 2011-08-10 NOTE — Telephone Encounter (Signed)
Pt req ultrasound results and lab results. Pls call asap.

## 2011-08-10 NOTE — Telephone Encounter (Signed)
[facility] Triage Call Report Triage Record Num: [id number] Operator: Migdalia Dk Patient Name: [name] Call Date & Time: 08/10/2011 1:18:38AM Patient Phone: [phone] PCP: Evelena Peat Patient Gender: Female PCP Fax : [phone] Patient DOB: 02-21-1976 [facility] Name: [facility] Reason for Call: Pts legs and hands and now her throat are swelling. Pt. states started with swelling of right ankle on 08/06/2011, now with swelling of right leg and now into hands and feels like throat swelling. States seen in office today due to swelling and scheduled for doppler US in a.m. States having SOB when up and moving around, tightness in throat and intermittent stabbing pains in chest. Pt. advised to call 911 now for evaluation. Pt. voiced understanding. Protocol(s) Used: Edema, Atraumatic Recommended Outcome per Protocol: Activate EMS 911 Reason for Outcome: New or worsening shortness of breath/difficulty breathing AND new onset of fatigue, weakness, confusion, or change in ability to care out daily activities Care Advice: ~ 08/

## 2011-08-11 ENCOUNTER — Ambulatory Visit (INDEPENDENT_AMBULATORY_CARE_PROVIDER_SITE_OTHER)
Admission: RE | Admit: 2011-08-11 | Discharge: 2011-08-11 | Disposition: A | Discharge status: Home / Self Care | Payer: BC Managed Care – PPO | Source: Ambulatory Visit | Attending: Family Medicine | Admitting: Family Medicine

## 2011-08-11 ENCOUNTER — Telehealth: Payer: Self-pay | Admitting: *Deleted

## 2011-08-11 DIAGNOSIS — M79609 Pain in unspecified limb: Secondary | ICD-10-CM

## 2011-08-11 DIAGNOSIS — M79606 Pain in leg, unspecified: Secondary | ICD-10-CM

## 2011-08-11 MED ORDER — HYDROCODONE-ACETAMINOPHEN 5-325 MG PO TABS: ORAL_TABLET | ORAL | Status: DC

## 2011-08-11 NOTE — Telephone Encounter (Signed)
See other phone note.  Go ahead with plain film L ankle.  I will order x-ray.

## 2011-08-11 NOTE — Telephone Encounter (Signed)
Detailed message left on pt phone [phone]

## 2011-08-11 NOTE — Telephone Encounter (Signed)
PC from pt, left ankle is giving her more pain.  She will not be in town next week for X-ray, pt requesting X-ray order to be done asap.  Pt using advil for pain with little relief [phone]

## 2011-08-11 NOTE — Telephone Encounter (Signed)
OK to call in Vicodin 5/325 1-2 po q 6 hours prn #30 with no refills if pain not relieved with OTC NSAIDS.  If she is still have lateral leg pain (around fibula) would go ahead with plain films of L fibula.

## 2011-08-11 NOTE — Telephone Encounter (Signed)
Pt informed on personally identified VM of meds and x-ray order

## 2011-08-12 ENCOUNTER — Other Ambulatory Visit: Payer: Self-pay | Admitting: Family Medicine

## 2011-08-12 ENCOUNTER — Telehealth: Payer: Self-pay | Admitting: *Deleted

## 2011-08-12 DIAGNOSIS — M25572 Pain in left ankle and joints of left foot: Secondary | ICD-10-CM

## 2011-08-12 NOTE — Telephone Encounter (Signed)
Opened in error

## 2011-08-12 NOTE — Progress Notes (Signed)
Quick Note:  Pt is not happy with wait and see for 2 weeks approach. Pt is in a lot of pain and is wanting to know what is going on, pt offered OV, declined, requesting MRI ______

## 2011-08-12 NOTE — Progress Notes (Signed)
Quick Note:  I spoke with pt, she agreed to referral to Ortho, requesting asap appt as she is traveling out of town next weekend Per Dr Caryl Never, he will make a referral to Ortho, does not agree with need for MRI at this time. ______

## 2011-10-15 ENCOUNTER — Other Ambulatory Visit: Payer: Self-pay

## 2011-10-15 ENCOUNTER — Telehealth: Payer: Self-pay | Admitting: Family Medicine

## 2011-10-15 MED ORDER — AMOXICILLIN-POT CLAVULANATE 875-125 MG PO TABS: 1.0000 | ORAL_TABLET | Freq: Two times a day (BID) | ORAL | Status: DC

## 2011-10-15 MED ORDER — AMOXICILLIN 875 MG PO TABS: 875.0000 mg | ORAL_TABLET | Freq: Two times a day (BID) | ORAL | Status: AC

## 2011-10-15 NOTE — Telephone Encounter (Signed)
Pt aware rx called into [facility]

## 2011-10-15 NOTE — Telephone Encounter (Signed)
DONE

## 2011-10-15 NOTE — Telephone Encounter (Signed)
Pt called and said that she is on her way back in to town and has ? Strep throat. Pt is req an abx, Amoxicillin,  to be called in to [facility] on [address] in [place]. Pt said that her dauhter had strep throat and evidently pt got it from her. Zpak does not work

## 2011-10-15 NOTE — Telephone Encounter (Signed)
Please advise 

## 2011-10-15 NOTE — Telephone Encounter (Signed)
OK to start Amoxicillin 875 mg po bid for 10 days given strep exposure.

## 2011-11-22 ENCOUNTER — Ambulatory Visit (INDEPENDENT_AMBULATORY_CARE_PROVIDER_SITE_OTHER): Payer: BC Managed Care – PPO | Admitting: Internal Medicine

## 2011-11-22 ENCOUNTER — Encounter: Payer: Self-pay | Admitting: Internal Medicine

## 2011-11-22 VITALS — BP 102/62 | HR 88 | Temp 97.8°F | Resp 18

## 2011-11-22 DIAGNOSIS — J329 Chronic sinusitis, unspecified: Secondary | ICD-10-CM

## 2011-11-22 MED ORDER — LEVOFLOXACIN 500 MG PO TABS: 500.0000 mg | ORAL_TABLET | Freq: Every day | ORAL | Status: AC

## 2011-11-22 NOTE — Progress Notes (Signed)
  Subjective:    Patient ID: [name], female    DOB: 10-Jan-1976, 35 y.o.   MRN: [id number]  HPI Pt presents to clinic for evaluation of sinus pressure. Notes 2wk h/o nasal congestion/drainage as well as bilateral maxillary and paranasal pain/pressure. No fever, chills or tooth pain. Taking otc medication without significant improvement. No other alleviating or exacerbating factors. No other complaints.  Past Medical History  Diagnosis Date  . Anemia   . Blood in stool   . Chicken pox   . Diverticulitis   . Hypertension   . Colon polyps    Past Surgical History  Procedure Date  . Cesarean section 2006    reports that she has quit smoking. She has never used smokeless tobacco. She reports that she drinks alcohol. Her drug history not on file. family history includes Cancer in her other; Crohn's disease in her father; Lupus in her father; Stroke in an unspecified family member; and Ulcerative colitis in her father. No Known Allergies   Review of Systems see hpi     Objective:   Physical Exam  Nursing note and vitals reviewed. Constitutional: She appears well-developed and well-nourished. No distress.  HENT:  Head: Normocephalic and atraumatic.  Right Ear: Tympanic membrane, external ear and ear canal normal.  Left Ear: External ear and ear canal normal. Tympanic membrane is erythematous.  Nose: Right sinus exhibits maxillary sinus tenderness and frontal sinus tenderness. Left sinus exhibits maxillary sinus tenderness and frontal sinus tenderness.  Mouth/Throat: Uvula is midline and oropharynx is clear and moist. No oropharyngeal exudate.  Eyes: Conjunctivae are normal. Right eye exhibits no discharge. Left eye exhibits no discharge. No scleral icterus.  Neck: Neck supple.  Pulmonary/Chest: Effort normal and breath sounds normal. No respiratory distress. She has no wheezes. She has no rales.  Neurological: She is alert.  Skin: Skin is warm and dry. She is not diaphoretic.    Psychiatric: She has a normal mood and affect.          Assessment & Plan:

## 2011-11-22 NOTE — Assessment & Plan Note (Signed)
Attempt course of levaquin to completion. Followup if no improvement or worsening.

## 2011-11-24 ENCOUNTER — Telehealth: Payer: Self-pay | Admitting: Internal Medicine

## 2011-11-24 NOTE — Telephone Encounter (Signed)
Patient states that she was seen in office on 11/22/11 for sinus infection. Her daughters have been diagnosed with the flu and now her symptoms are beginning to worsen. Fever and body aches. Patient would like to know if Dr. Rodena Medin would call her in Tamiflu to [name] on [address].

## 2011-11-25 MED ORDER — OSELTAMIVIR PHOSPHATE 75 MG PO CAPS: 75.0000 mg | ORAL_CAPSULE | Freq: Two times a day (BID) | ORAL | Status: AC

## 2011-11-25 NOTE — Telephone Encounter (Signed)
Pt.notified

## 2011-11-25 NOTE — Telephone Encounter (Signed)
tamiflu 75mg  po bid x10d

## 2011-11-25 NOTE — Telephone Encounter (Signed)
Received call from pharmacist stating Tamiflu is only covered by insurance for 5 days. Is it ok to change directions to twice a day x 5 days and quantity of 10?

## 2011-11-25 NOTE — Telephone Encounter (Signed)
Sorry. 5d for treatment

## 2011-12-13 ENCOUNTER — Encounter: Payer: Self-pay | Admitting: Family

## 2011-12-13 ENCOUNTER — Ambulatory Visit (INDEPENDENT_AMBULATORY_CARE_PROVIDER_SITE_OTHER): Payer: BC Managed Care – PPO | Admitting: Family

## 2011-12-13 VITALS — BP 100/74 | HR 96 | Temp 98.0°F | Resp 16 | Wt 199.1 lb

## 2011-12-13 DIAGNOSIS — J329 Chronic sinusitis, unspecified: Secondary | ICD-10-CM

## 2011-12-13 MED ORDER — FLUTICASONE PROPIONATE 50 MCG/ACT NA SUSP: 2.0000 | Freq: Every day | NASAL | Status: DC

## 2011-12-13 MED ORDER — LEVOFLOXACIN 500 MG PO TABS: 500.0000 mg | ORAL_TABLET | Freq: Every day | ORAL | Status: AC

## 2011-12-13 NOTE — Patient Instructions (Signed)

## 2011-12-13 NOTE — Assessment & Plan Note (Addendum)
Pt reports intolerant to augmentin due to gi side effects. Has tolerated levaquin. She is instructed to contact us if her symptoms worsen or if they fail to improve. She wishes to stop afrin which I agree will. Will switch to flonase for now. She can stop after sinusitis is resolved.

## 2011-12-13 NOTE — Progress Notes (Signed)
  Subjective:    Patient ID: [name], female    DOB: [birth year], 35 y.o.   MRN: [id number]  HPI  [name] is a 35 yr old female who presents today with chief complaint of sinus headache/pressure.  + nasal congestion and post-nasal congestion. "can't sleep due to thick nasal secretions.  Symptom started >1 month ago.  Denies associated fever.  She reports that she had the flu the week before christmas.  She has tried mucinex-D and afrin x 5 days with little improvement.    Review of Systems Past Medical History  Diagnosis Date  . Anemia   . Blood in stool   . Chicken pox   . Diverticulitis   . Hypertension   . Colon polyps     History   Social History  . Marital Status: Married    Spouse Name: [name]    Number of Children: [name]  . Years of Education: [name]   Occupational History  . Not on file.   Social History Main Topics  . Smoking status: Former Games developer  . Smokeless tobacco: Never Used  . Alcohol Use: Yes  . Drug Use: Not on file  . Sexually Active: Not on file   Other Topics Concern  . Not on file   Social History Narrative  . No narrative on file    Past Surgical History  Procedure Date  . Cesarean section 2006    Family History  Problem Relation Age of Onset  . Stroke      family hx  . Crohn's disease Father   . Lupus Father   . Ulcerative colitis Father   . Cancer Other     breast    No Known Allergies  Current Outpatient Prescriptions on File Prior to Visit  Medication Sig Dispense Refill  . fish oil-omega-3 fatty acids 1000 MG capsule Take 1 g by mouth 2 (two) times daily.       . valsartan-hydrochlorothiazide (DIOVAN-HCT) 80-12.5 MG per tablet Take 1 tablet by mouth daily.  30 tablet  11    BP 100/74  Pulse 96  Temp(Src) 98 F (36.7 C) (Oral)  Resp 16  Wt 199 lb 1.3 oz (90.302 kg)  SpO2 99%       Objective:   Physical Exam  Constitutional: She appears well-developed and well-nourished.  HENT:  Head: Normocephalic and  atraumatic.  Mouth/Throat: Oropharynx is clear and moist. No oropharyngeal exudate.       Bilateral serous effusions without erythema or bulging.  Eyes: Conjunctivae are normal. Pupils are equal, round, and reactive to light.  Cardiovascular: Normal rate and regular rhythm.   No murmur heard. Pulmonary/Chest: Breath sounds normal. No respiratory distress. She has no wheezes. She has no rales. She exhibits no tenderness.  Abdominal: Soft. Bowel sounds are normal. She exhibits no distension and no mass. There is no tenderness. There is no rebound and no guarding.  Lymphadenopathy:    She has no cervical adenopathy.  Skin: Skin is warm and dry. No erythema.  Psychiatric: She has a normal mood and affect. Her behavior is normal. Judgment and thought content normal.          Assessment & Plan:

## 2012-01-28 ENCOUNTER — Ambulatory Visit: Payer: BC Managed Care – PPO | Admitting: Family Medicine

## 2012-02-07 ENCOUNTER — Ambulatory Visit (INDEPENDENT_AMBULATORY_CARE_PROVIDER_SITE_OTHER): Payer: BC Managed Care – PPO | Admitting: Family

## 2012-02-07 ENCOUNTER — Encounter: Payer: Self-pay | Admitting: Family

## 2012-02-07 DIAGNOSIS — J309 Allergic rhinitis, unspecified: Secondary | ICD-10-CM | POA: Insufficient documentation

## 2012-02-07 MED ORDER — FLUTICASONE PROPIONATE 50 MCG/ACT NA SUSP: 2.0000 | Freq: Every day | NASAL | Status: DC

## 2012-02-07 NOTE — Patient Instructions (Signed)
Allergic Rhinitis Allergic rhinitis is when the mucous membranes in the nose respond to allergens. Allergens are particles in the air that cause your body to have an allergic reaction. This causes you to release allergic antibodies. Through a chain of events, these eventually cause you to release histamine into the blood stream (hence the use of antihistamines). Although meant to be protective to the body, it is this release that causes your discomfort, such as frequent sneezing, congestion and an itchy runny nose.  CAUSES  The pollen allergens may come from grasses, trees, and weeds. This is seasonal allergic rhinitis, or "hay fever." Other allergens cause year-round allergic rhinitis (perennial allergic rhinitis) such as house dust mite allergen, pet dander and mold spores.  SYMPTOMS   Nasal stuffiness (congestion).   Runny, itchy nose with sneezing and tearing of the eyes.   There is often an itching of the mouth, eyes and ears.  It cannot be cured, but it can be controlled with medications. DIAGNOSIS  If you are unable to determine the offending allergen, skin or blood testing may find it. TREATMENT   Avoid the allergen.   Medications and allergy shots (immunotherapy) can help.   Hay fever may often be treated with antihistamines in pill or nasal spray forms. Antihistamines block the effects of histamine. There are over-the-counter medicines that may help with nasal congestion and swelling around the eyes. Check with your caregiver before taking or giving this medicine.  If the treatment above does not work, there are many new medications your caregiver can prescribe. Stronger medications may be used if initial measures are ineffective. Desensitizing injections can be used if medications and avoidance fails. Desensitization is when a patient is given ongoing shots until the body becomes less sensitive to the allergen. Make sure you follow up with your caregiver if problems continue. SEEK  MEDICAL CARE IF:   You develop fever (more than 100.5 F (38.1 C).   You develop a cough that does not stop easily (persistent).   You have shortness of breath.   You start wheezing.   Symptoms interfere with normal daily activities.  Document Released: 08/24/2001 Document Revised: 08/11/2011 Document Reviewed: 03/05/2009 [employer] Patient Information 2012 [employer]. 

## 2012-02-07 NOTE — Progress Notes (Signed)
  Subjective:    Patient ID: [name], female    DOB: 1976/08/18, 36 y.o.   MRN: [id number]  HPI  Ms.  [name] is a 36 yr old female who presents today with chief complaint of nasal congestion.   She reports associated itchy watery eyes, sneezing and scratchy throat since Friday.  She has tried zyrtec, and sudafed with mild improvement.  Denies associated fever, but notes that she did feel hot yesterday.  Feels like her previous episodes of allergies.  Pt reports that she had good relief of her nasal congestion with flonase, but ran out a few weeks ago.      Review of Systems See HPI    Past Medical History  Diagnosis Date  . Anemia   . Blood in stool   . Chicken pox   . Diverticulitis   . Hypertension   . Colon polyps     History   Social History  . Marital Status: Married    Spouse Name: [name]    Number of Children: [name]  . Years of Education: [name]   Occupational History  . Not on file.   Social History Main Topics  . Smoking status: Former Games developer  . Smokeless tobacco: Never Used  . Alcohol Use: Yes  . Drug Use: Not on file  . Sexually Active: Not on file   Other Topics Concern  . Not on file   Social History Narrative  . No narrative on file    Past Surgical History  Procedure Date  . Cesarean section 2006    Family History  Problem Relation Age of Onset  . Stroke      family hx  . Crohn's disease Father   . Lupus Father   . Ulcerative colitis Father   . Cancer Other     breast    No Known Allergies  Current Outpatient Prescriptions on File Prior to Visit  Medication Sig Dispense Refill  . fish oil-omega-3 fatty acids 1000 MG capsule Take 1 g by mouth 2 (two) times daily.       Marland Kitchen levonorgestrel (MIRENA) 20 MCG/24HR IUD 1 each by Intrauterine route once.      . valsartan-hydrochlorothiazide (DIOVAN-HCT) 80-12.5 MG per tablet Take 1 tablet by mouth daily.  30 tablet  11    BP 102/72  Pulse 100  Temp(Src) 97.8 F (36.6 C) (Oral)  Resp 16  Wt  203 lb 1.3 oz (92.116 kg)  SpO2 98%    Objective:   Physical Exam  Constitutional: She appears well-developed and well-nourished. No distress.  HENT:  Head: Normocephalic and atraumatic.  Right Ear: Tympanic membrane and ear canal normal.  Left Ear: Tympanic membrane and ear canal normal.  Mouth/Throat: No posterior oropharyngeal edema, posterior oropharyngeal erythema or tonsillar abscesses.  Eyes: Conjunctivae are normal. Pupils are equal, round, and reactive to light.  Cardiovascular: Normal rate and regular rhythm.   No murmur heard. Pulmonary/Chest: Effort normal and breath sounds normal. No respiratory distress. She has no wheezes. She has no rales. She exhibits no tenderness.          Assessment & Plan:

## 2012-02-07 NOTE — Assessment & Plan Note (Signed)
Recommended that she continue zyrtec, resume flonase.

## 2012-02-18 ENCOUNTER — Other Ambulatory Visit: Payer: Self-pay | Admitting: Internal Medicine

## 2012-02-21 NOTE — Telephone Encounter (Signed)
Rx refill sent to pharmacy. 

## 2012-03-07 IMAGING — CT CT ABD-PELV W/ CM
2 of 4 series · 16 of 46 positions shown, 18 images · IV contrast (APPLIED)
Comparison: None

CLINICAL DATA: Abdominal pain.  Diarrhea.

CT ABDOMEN AND PELVIS WITH CONTRAST
TECHNIQUE: Multidetector CT imaging of the abdomen and pelvis was
performed following the standard protocol during bolus
administration of intravenous contrast.
Contrast: 100 ml [id number]-E00

[Series 2: abd/pelvis 5.0 b31f · axial · 0.82mm/px · z∈[-472,-32]mm · 13 of 98 slices shown, 15 images]
[im 5/98  soft-tissue]
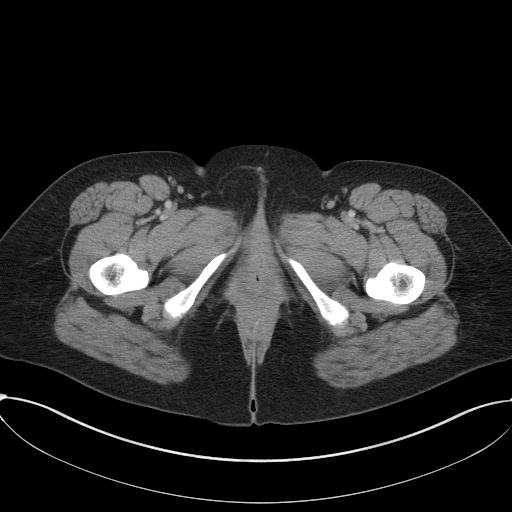
[im 5/98  bone]
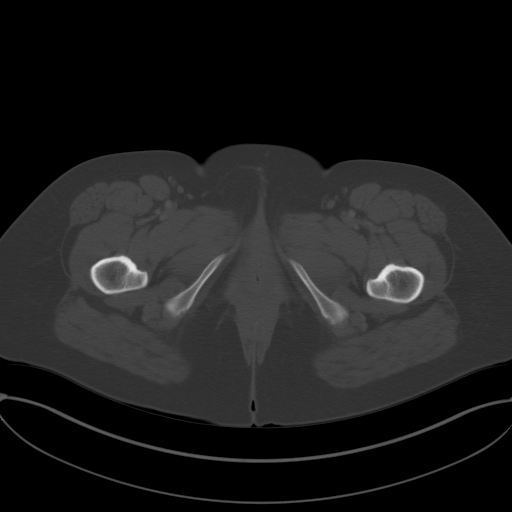
[im 13/98  soft-tissue]
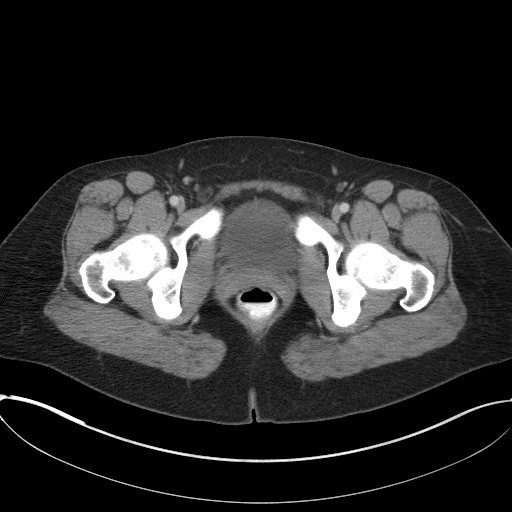
[im 21/98  soft-tissue]
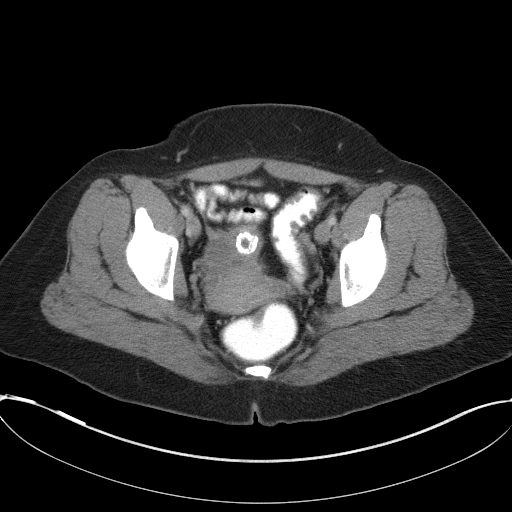
[im 29/98  soft-tissue]
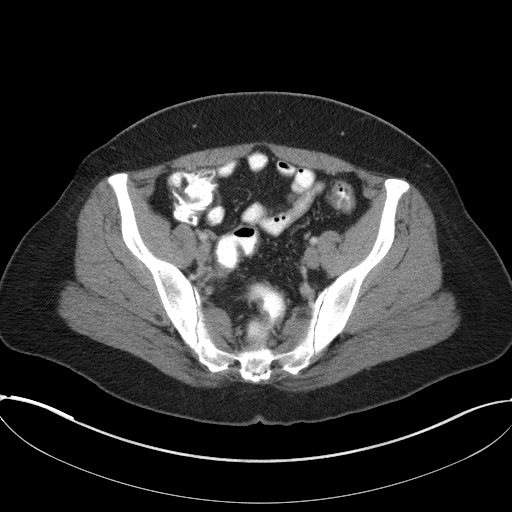
[im 33/98  soft-tissue]
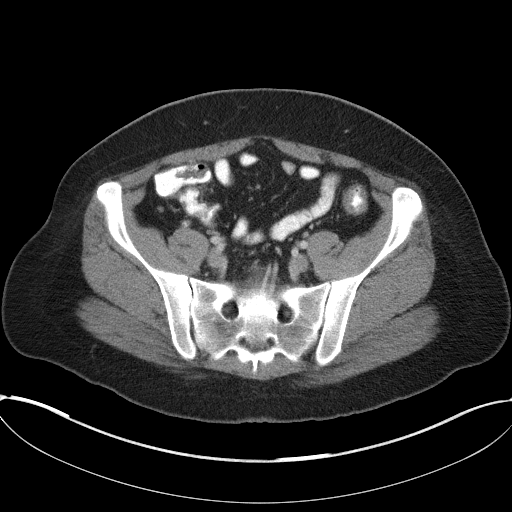
[im 41/98  soft-tissue]
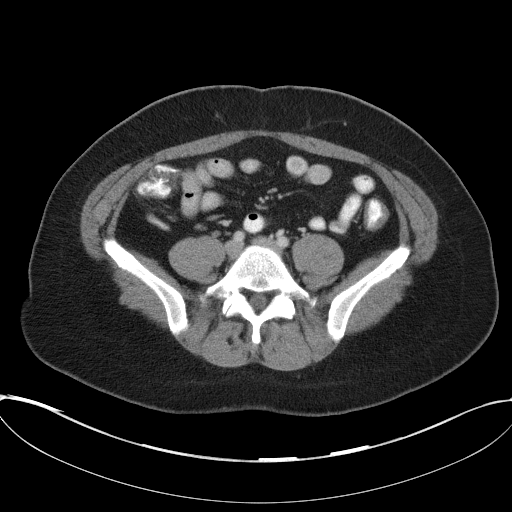
[im 49/98  soft-tissue]
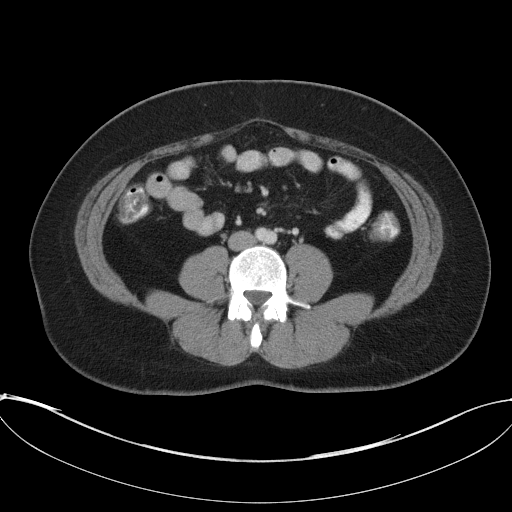
[im 57/98  soft-tissue]
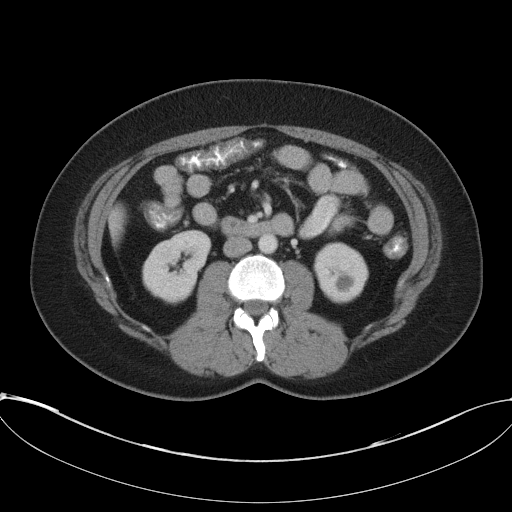
[im 65/98  soft-tissue]
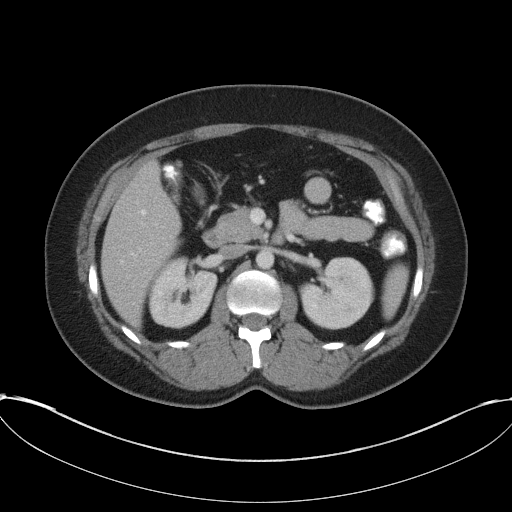
[im 65/98  bone]
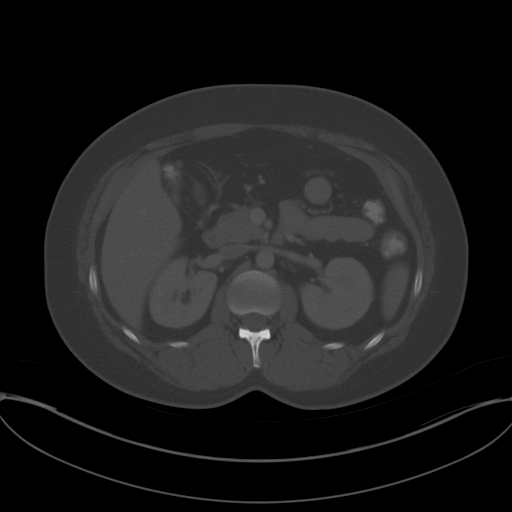
[im 69/98  soft-tissue]
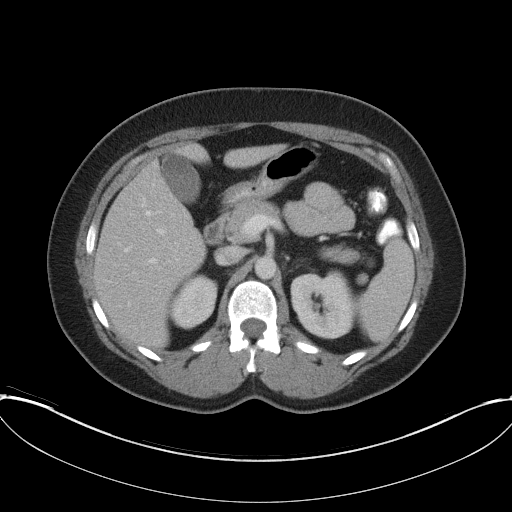
[im 77/98  soft-tissue]
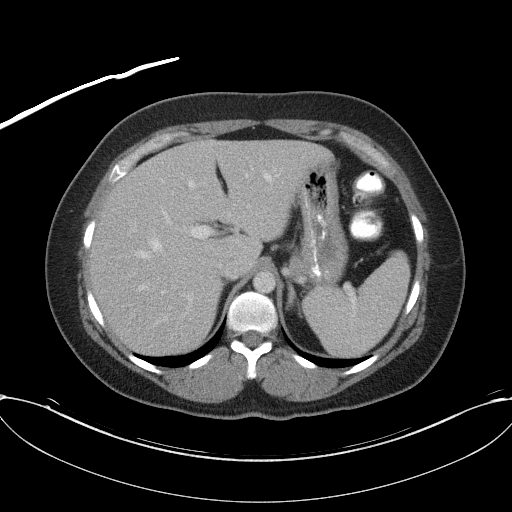
[im 85/98  soft-tissue]
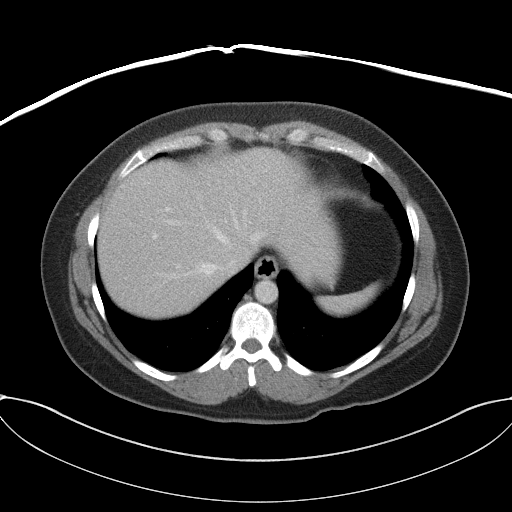
[im 93/98  soft-tissue]
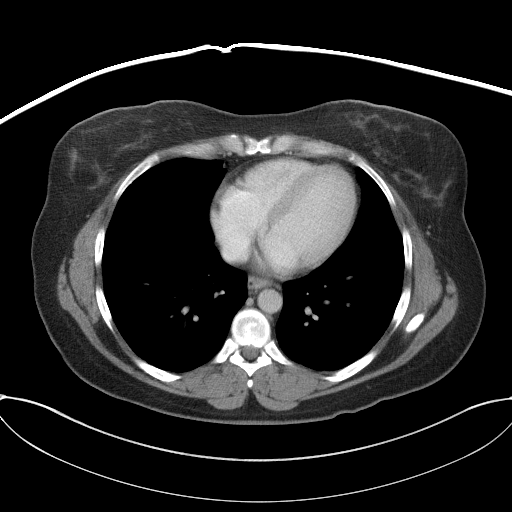

[Series 5: abd/pelvis 3.0 coronal · coronal · 0.84mm/px · 3 of 82 slices shown]
[im 28/82  soft-tissue]
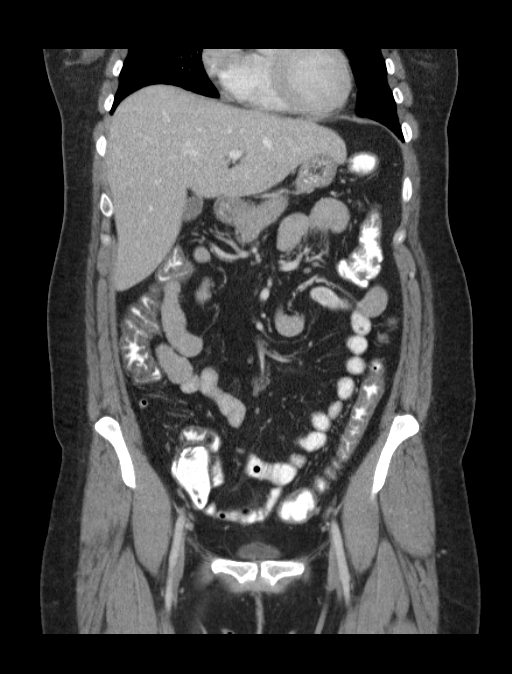
[im 37/82  soft-tissue]
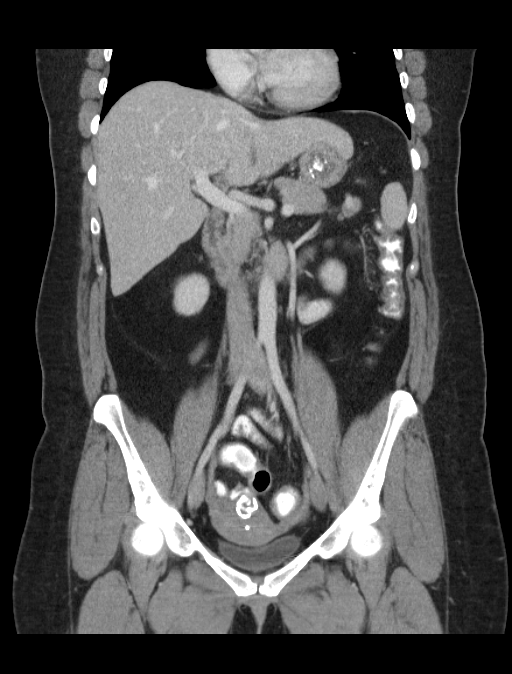
[im 46/82  soft-tissue]
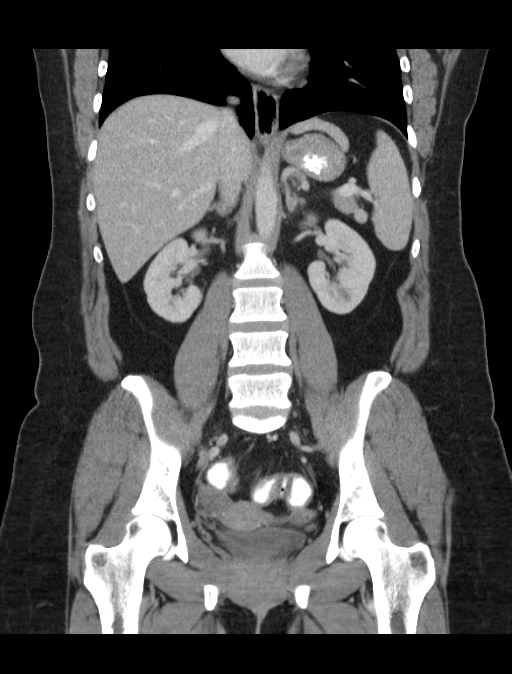

[16 of 46 positions shown; findings below may reference images not displayed]

FINDINGS: The lung bases are clear.

The liver, spleen, pancreas, adrenal glands and kidneys are normal.
The gallbladder is normal.  No biliary dilatation.

The stomach and duodenum are normal.  The small bowel is
unremarkable except for a the terminal ileum which demonstrates
mild wall thickening.  The colon demonstrates mild diffuse wall
thickening.  No pericolonic inflammatory changes.  Findings may be
secondary to inflammatory bowel disease such as Crohn's disease or
ulcerative colitis.  C difficile colitis is also possible.  The
appendix is normal.

The aorta is normal in caliber.  The major branch vessels are
normal.  There are small scattered mesenteric and retroperitoneal
lymph nodes but no adenopathy.

The uterus contains a calcified fibroid and an intrauterine device.
The ovaries are normal.  The bladder is normal.  No pelvic
adenopathy or free pelvic fluid collections.

The bony structures are normal.
IMPRESSION: 1.  Diffuse inflammation of the colon and also mild inflammation of
the terminal ileum.  Findings could be due to inflammatory bowel
disease or C difficile colitis.
2.  Normal appendix.
3.  Normal appearance of the solid abdominal organs.

## 2012-03-11 IMAGING — CR DG ABDOMEN 2V
2 series · 2 of 2 positions shown · non-contrast
Comparison: CT 12/04/2010.

CLINICAL DATA: 34-year-old female with abdominal pain, bloating,
nausea, diarrhea.  Inflamed colon.

ABDOMEN - 2 VIEW

[w abdomen upright]
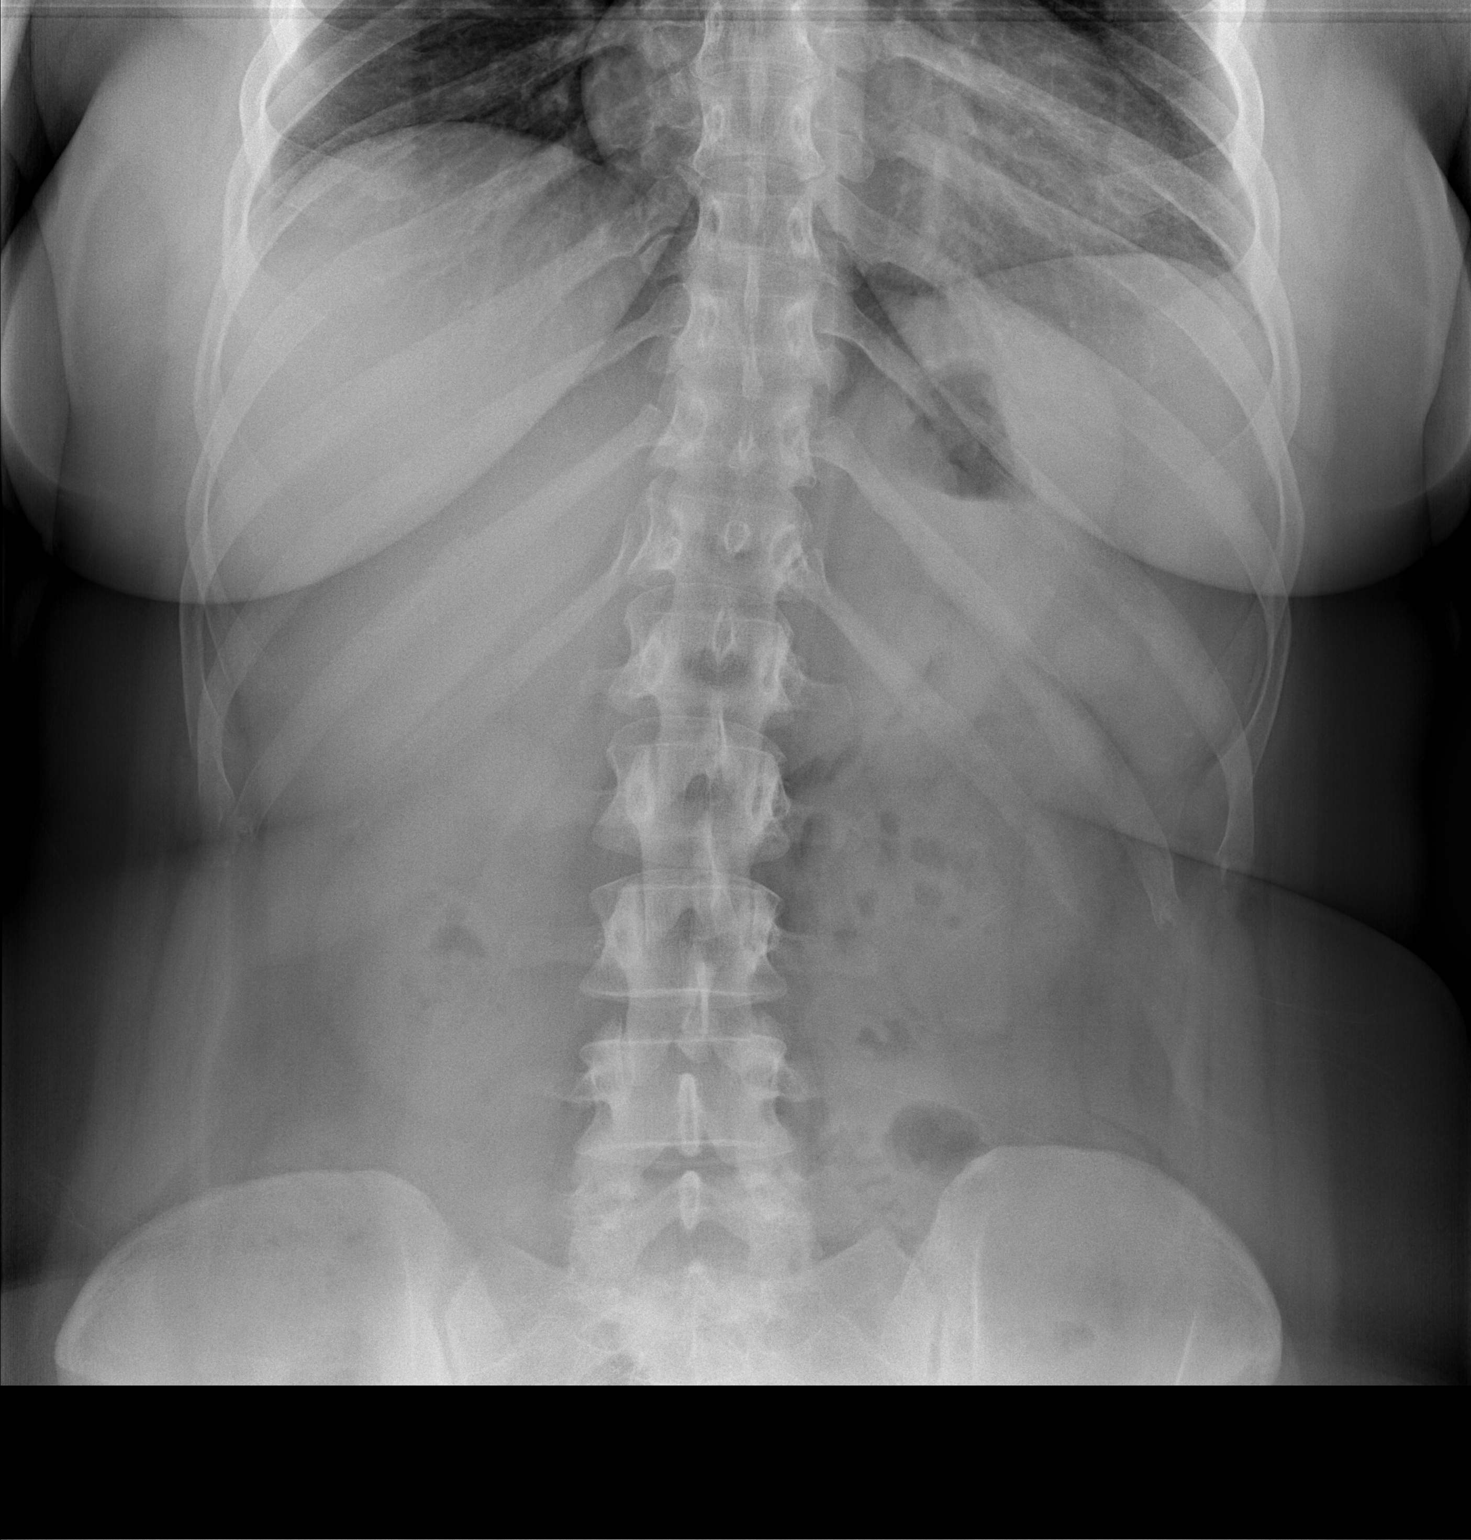

[t abdomen supine]
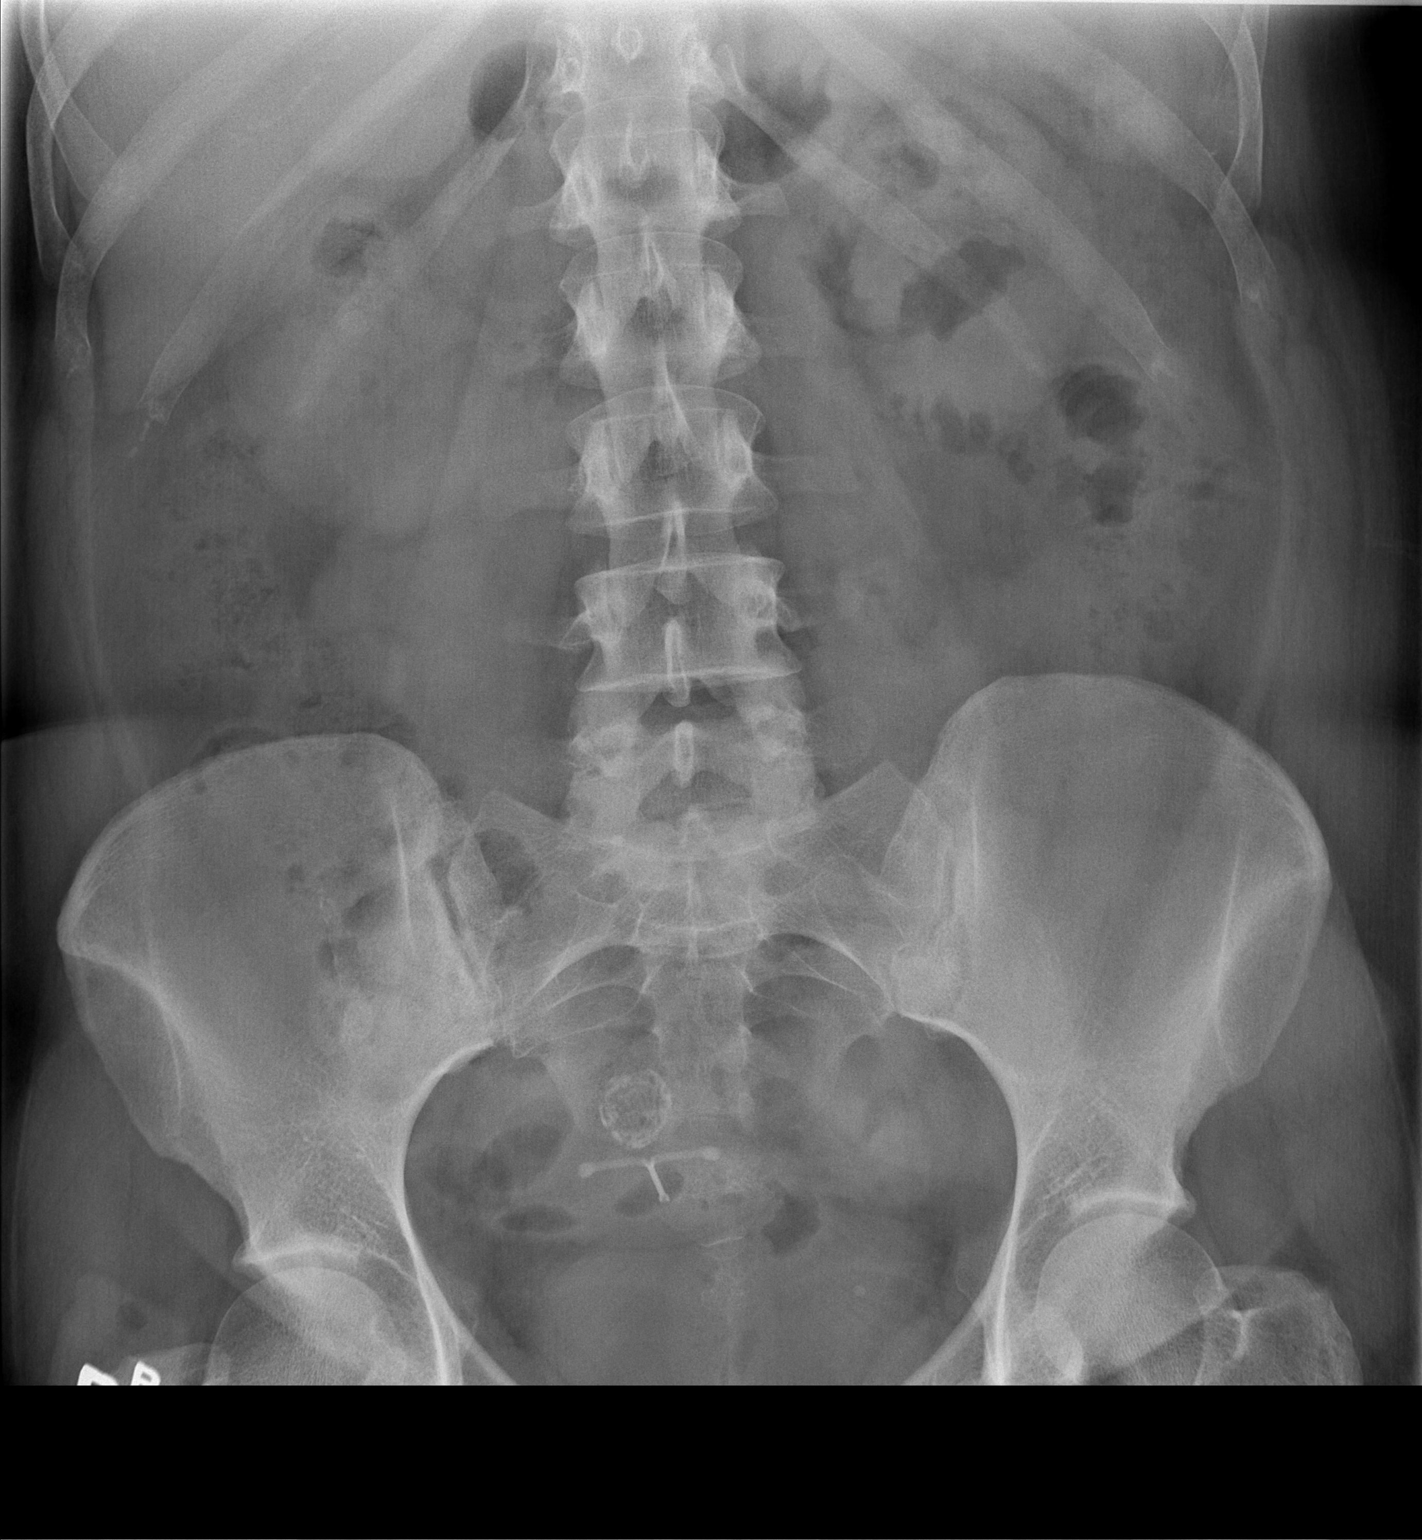

[2 of 2 positions shown; findings below may reference images not displayed]

FINDINGS: Nonobstructed bowel gas pattern.  No pneumoperitoneum.
IUD and fibroid calcification re-identified in the pelvis.  Small
left pelvic phlebolith also unchanged.  Abdominal  visceral
contours are within normal limits. No acute osseous abnormality
identified.    Lung bases are clear.
IMPRESSION: Nonobstructed bowel gas pattern, no free air.

## 2012-04-12 ENCOUNTER — Encounter: Payer: Self-pay | Admitting: Family

## 2012-04-12 ENCOUNTER — Ambulatory Visit (INDEPENDENT_AMBULATORY_CARE_PROVIDER_SITE_OTHER): Payer: BC Managed Care – PPO | Admitting: Family

## 2012-04-12 ENCOUNTER — Other Ambulatory Visit: Payer: Self-pay | Admitting: Family

## 2012-04-12 VITALS — BP 106/84 | HR 90 | Temp 98.1°F | Resp 16 | Wt 206.1 lb

## 2012-04-12 DIAGNOSIS — R319 Hematuria, unspecified: Secondary | ICD-10-CM | POA: Insufficient documentation

## 2012-04-12 DIAGNOSIS — R3129 Other microscopic hematuria: Secondary | ICD-10-CM

## 2012-04-12 DIAGNOSIS — M545 Low back pain, unspecified: Secondary | ICD-10-CM

## 2012-04-12 DIAGNOSIS — R197 Diarrhea, unspecified: Secondary | ICD-10-CM | POA: Insufficient documentation

## 2012-04-12 DIAGNOSIS — R110 Nausea: Secondary | ICD-10-CM

## 2012-04-12 LAB — CBC WITH DIFFERENTIAL/PLATELET
Basophils Absolute: 0.1 10*3/uL (ref 0.0–0.1)
Basophils Relative: 1 % (ref 0–1)
Eosinophils Absolute: 0.3 10*3/uL (ref 0.0–0.7)
Eosinophils Relative: 3 % (ref 0–5)
MCH: 25.9 pg — ABNORMAL LOW (ref 26.0–34.0)
MCHC: 31.4 g/dL (ref 30.0–36.0)
MCV: 82.6 fL (ref 78.0–100.0)
Neutrophils Relative %: 58 % (ref 43–77)
Platelets: 287 10*3/uL (ref 150–400)
RBC: 5.17 MIL/uL — ABNORMAL HIGH (ref 3.87–5.11)
RDW: 13.8 % (ref 11.5–15.5)

## 2012-04-12 LAB — POCT URINALYSIS DIPSTICK
Ketones, UA: NEGATIVE
Leukocytes, UA: NEGATIVE
Nitrite, UA: POSITIVE
Protein, UA: NEGATIVE
Urobilinogen, UA: 0.2
pH, UA: 7.5

## 2012-04-12 LAB — HEPATIC FUNCTION PANEL
Albumin: 4.8 g/dL (ref 3.5–5.2)
Alkaline Phosphatase: 76 U/L (ref 39–117)
Total Bilirubin: 0.4 mg/dL (ref 0.3–1.2)
Total Protein: 7.4 g/dL (ref 6.0–8.3)

## 2012-04-12 LAB — LIPASE: Lipase: 19 U/L (ref 0–75)

## 2012-04-12 MED ORDER — CIPROFLOXACIN HCL 500 MG PO TABS: 500.0000 mg | ORAL_TABLET | Freq: Two times a day (BID) | ORAL | Status: DC

## 2012-04-12 NOTE — Patient Instructions (Signed)
Please complete your blood work prior to leaving. Start cipro. Call if your develop worsening pain, nausea, if symptoms worsen, or if you are not feeling improved in 2-3 days.

## 2012-04-12 NOTE — Assessment & Plan Note (Addendum)
Will plan empiric rx for UTI with cipro.  No kidney stones on last CT back in 2011, neg CVAT, low back pain is generalized- doubt kidney stone.  However, if symptoms worsen or if they do not improve consider CT abd/pelvis. Pt is aware of plan and to notify us as needed.

## 2012-04-12 NOTE — Progress Notes (Signed)
Subjective:    Patient ID: [name], female    DOB: 04-27-1976, 36 y.o.   MRN: [id number]  HPI  [name] is a 36 yr old female who presents today with several complaints.  Her primary concern is diarrhea.  Symptoms started a few weeks ago and are associated with intermittent constipation and fecal urgency. Also, reports associated nausea- worse the last few days.  Has some epigastric spasms, throughout the day. +anorexia.  +abdominal bloading and generalized soreness.  + low back pain yesterday- across the lower back. She describes this as dull and aching in nature.  Denies associated  dysuria or urinary frequency.  Urine is darker in color, but no obvious blood.  Denies hx of kidney stones.  Some streaking blood on the stools.  Stools can be mucoid at times.  She denies associated fever.    Review of Systems    see HPI  Past Medical History  Diagnosis Date  . Anemia   . Blood in stool   . Chicken pox   . Diverticulitis   . Hypertension   . Colon polyps     History   Social History  . Marital Status: Married    Spouse Name: [name]    Number of Children: [name]  . Years of Education: [name]   Occupational History  . Not on file.   Social History Main Topics  . Smoking status: Former Games developer  . Smokeless tobacco: Never Used  . Alcohol Use: Yes  . Drug Use: Not on file  . Sexually Active: Not on file   Other Topics Concern  . Not on file   Social History Narrative  . No narrative on file    Past Surgical History  Procedure Date  . Cesarean section 2006    Family History  Problem Relation Age of Onset  . Stroke      family hx  . Crohn's disease Father   . Lupus Father   . Ulcerative colitis Father   . Cancer Other     breast    No Known Allergies  Current Outpatient Prescriptions on File Prior to Visit  Medication Sig Dispense Refill  . cetirizine (ZYRTEC) 10 MG tablet Take 10 mg by mouth daily as needed.      . fish oil-omega-3 fatty acids 1000 MG capsule  Take 1 g by mouth 2 (two) times daily.       . fluticasone (FLONASE) 50 MCG/ACT nasal spray Place 2 sprays into the nose daily.  16 g  5  . levonorgestrel (MIRENA) 20 MCG/24HR IUD 1 each by Intrauterine route once.      . valsartan-hydrochlorothiazide (DIOVAN-HCT) 80-12.5 MG per tablet TAKE 1 TABLET BY MOUTH DAILY.  30 tablet  3    BP 106/84  Pulse 90  Temp(Src) 98.1 F (36.7 C) (Oral)  Resp 16  Wt 206 lb 1.9 oz (93.495 kg)  SpO2 99%    Objective:   Physical Exam  Constitutional: She appears well-developed and well-nourished. No distress.  Cardiovascular: Normal rate and regular rhythm.   No murmur heard. Pulmonary/Chest: Effort normal and breath sounds normal.  Abdominal: Soft. Bowel sounds are normal. She exhibits no distension. There is no tenderness.  Genitourinary:       Neg CVAT bilaterally.  Skin: Skin is warm and dry. No erythema.  Psychiatric: She has a normal mood and affect. Her behavior is normal. Judgment and thought content normal.          Assessment & Plan:

## 2012-04-12 NOTE — Assessment & Plan Note (Addendum)
No acute abdominal findings today on exam. ? IBS, She reports hx of "colitis" in the past. She has seen Dr. Loreta Ave (GI).  Dad has ulcerative colitis.  Will obtain CBC, lfts, amylase (she is s/p cholecystectomy)- refer back to GI for further evaluation.

## 2012-04-13 LAB — BASIC METABOLIC PANEL
BUN: 12 mg/dL (ref 6–23)
Chloride: 102 mEq/L (ref 96–112)
Potassium: 4.1 mEq/L (ref 3.5–5.3)

## 2012-04-14 ENCOUNTER — Telehealth: Payer: Self-pay | Admitting: Family

## 2012-04-14 LAB — URINE CULTURE: Colony Count: NO GROWTH

## 2012-04-14 NOTE — Telephone Encounter (Signed)
Pls let pt know urine culture is neg.  She can stop antibiotics.

## 2012-04-14 NOTE — Telephone Encounter (Signed)
Notified pt, she states that she saw Dr Loreta Ave yesterday and was told that the infection had moved further into her kidneys. She reports that Dr Loreta Ave has advised her to continue the antibiotic and added more. Advised pt ok to follow Dr Kenna Gilbert instruction and I would let Melissa know.

## 2012-04-14 NOTE — Telephone Encounter (Signed)
Call placed to patient at  [phone], she was informed per Sandford Craze instructions. She stated that she is under the specialist care and was not sure that she needed to return Monday for a appointment. She stated if she has any additional concerns that she will call back to schedule. She has declined to schedule follow up.

## 2012-04-14 NOTE — Telephone Encounter (Signed)
Ok, I would like to see her in office please on Monday.

## 2012-04-22 ENCOUNTER — Telehealth: Payer: Self-pay | Admitting: Internal Medicine

## 2012-04-22 MED ORDER — CIPROFLOXACIN HCL 500 MG PO TABS: 500.0000 mg | ORAL_TABLET | Freq: Two times a day (BID) | ORAL | Status: AC

## 2012-04-22 NOTE — Telephone Encounter (Signed)
Pt of Dr. Loreta Ave, seen recently for generalized abd soreness, bloating, some diarrhea alternating with constipation.  Some intermittent blood in stool with diarrhea.  Pt calls to report seen by PCP for lower back pain, center of lower back.  Blood work and UA, + RBC and nitrites and told she had UTI.  Saw [facility] and told to keep on cipro and added pyridium.  Completed Cipro 3 days ago and now back pain is back full force. Took APAP without much relief. Back pain fully resolved with cipro and now returned and feels "the same and more intensified".  Still no dysuria or hematuria No f/c.  No n/v.  On chart review, culture was negative.  Will refill cipro 500 mg BID x 3 days.  She is instructed to contact Dr. Kenna Gilbert office on Monday and her PCP's office on Monday to be seen and make a plan going forward. She voices understanding and is instructed to seek care in urgent care or the ED if worsening pain, fever, chills, n/v, bleeding.

## 2012-04-24 ENCOUNTER — Telehealth: Payer: Self-pay | Admitting: Family

## 2012-04-24 ENCOUNTER — Encounter: Payer: Self-pay | Admitting: Family

## 2012-04-24 ENCOUNTER — Ambulatory Visit (INDEPENDENT_AMBULATORY_CARE_PROVIDER_SITE_OTHER): Payer: BC Managed Care – PPO | Admitting: Family

## 2012-04-24 VITALS — BP 145/91 | HR 83 | Temp 98.4°F | Resp 18 | Ht 65.51 in | Wt 205.0 lb

## 2012-04-24 DIAGNOSIS — M545 Low back pain, unspecified: Secondary | ICD-10-CM | POA: Insufficient documentation

## 2012-04-24 DIAGNOSIS — M549 Dorsalgia, unspecified: Secondary | ICD-10-CM

## 2012-04-24 MED ORDER — METHYLPREDNISOLONE 4 MG PO KIT: PACK | ORAL | Status: AC

## 2012-04-24 MED ORDER — CYCLOBENZAPRINE HCL 10 MG PO TABS: 10.0000 mg | ORAL_TABLET | Freq: Every evening | ORAL | Status: AC | PRN

## 2012-04-24 NOTE — Progress Notes (Signed)
Subjective:    Patient ID: [name], female    DOB: 23-Apr-1976, 36 y.o.   MRN: [id number]  HPI  Pt presents today for follow up of her low back pain.  She was initially seen in the office on 04/12/12 for low back pain.  UA noted moderated blood.  She was treated empirically with cipro.  She was seen a few days latter by Dr Loreta Ave, her gastroenterologist who added pyridium.  She reports that within a few days of starting cipro, her back felt better.  Saturday, she developed recurrent lower middle back pain. She reports that the pain is worse with movement.  Pain is described as a stabbing pain.  Pain improves with rest.  Denies associated fever or dysuria.  She has had urgency periodically but reports that she has been drinking 80 ounces of water a day. She reports that her urine is discolored due to pyridium.    Review of Systems    see HPI  Past Medical History  Diagnosis Date  . Anemia   . Blood in stool   . Chicken pox   . Diverticulitis   . Hypertension   . Colon polyps     History   Social History  . Marital Status: Married    Spouse Name: [name]    Number of Children: [name]  . Years of Education: [name]   Occupational History  . Not on file.   Social History Main Topics  . Smoking status: Former Games developer  . Smokeless tobacco: Never Used  . Alcohol Use: Yes  . Drug Use: Not on file  . Sexually Active: Not on file   Other Topics Concern  . Not on file   Social History Narrative  . No narrative on file    Past Surgical History  Procedure Date  . Cesarean section 2006  . Laparoscopic cholecystectomy 2012    Family History  Problem Relation Age of Onset  . Stroke      family hx  . Crohn's disease Father   . Lupus Father   . Ulcerative colitis Father   . Cancer Other     breast    No Known Allergies  Current Outpatient Prescriptions on File Prior to Visit  Medication Sig Dispense Refill  . cetirizine (ZYRTEC) 10 MG tablet Take 10 mg by mouth daily as needed.       . ciprofloxacin (CIPRO) 500 MG tablet Take 1 tablet (500 mg total) by mouth 2 (two) times daily.  6 tablet  0  . levonorgestrel (MIRENA) 20 MCG/24HR IUD 1 each by Intrauterine route once.      . valsartan-hydrochlorothiazide (DIOVAN-HCT) 80-12.5 MG per tablet TAKE 1 TABLET BY MOUTH DAILY.  30 tablet  3  . DISCONTD: fluticasone (FLONASE) 50 MCG/ACT nasal spray Place 2 sprays into the nose daily.  16 g  5    BP 145/91  Pulse 83  Temp(Src) 98.4 F (36.9 C) (Oral)  Resp 18  Ht 5' 5.51" (1.664 m)  Wt 205 lb (92.987 kg)  BMI 33.58 kg/m2  SpO2 99%    Objective:   Physical Exam  Constitutional: She appears well-developed and well-nourished. No distress.  Cardiovascular: Normal rate and regular rhythm.   No murmur heard. Pulmonary/Chest: Effort normal and breath sounds normal. No respiratory distress. She has no wheezes. She has no rales. She exhibits no tenderness.  Abdominal: Bowel sounds are normal. She exhibits no distension and no mass. There is no tenderness. There is no rebound and no  guarding.  Genitourinary:       Neg CVAT bilaterally.  Skin: Skin is warm and dry.  Psychiatric: She has a normal mood and affect. Her behavior is normal. Judgment and thought content normal.          Assessment & Plan:

## 2012-04-24 NOTE — Assessment & Plan Note (Signed)
Differential includes musculoskeletal pain and kidney stone.  Will obtain CT abd/pelvis to further evaluate.

## 2012-04-24 NOTE — Telephone Encounter (Addendum)
Received call from [facility] re:  CT report.  Report notes no renal calculi or urinary tract changes  + diverticulosis, + Uterine fibroid.  Called patient, reviewed CT results and plans for medrol and flexeril HS.  Pt to call if symptoms worsen or if no improvement. Pt verbalizes understanding.

## 2012-04-24 NOTE — Patient Instructions (Signed)
Please complete ct on the first floor.

## 2012-04-28 ENCOUNTER — Telehealth: Payer: Self-pay | Admitting: *Deleted

## 2012-04-28 MED ORDER — TRAMADOL HCL 50 MG PO TABS: 50.0000 mg | ORAL_TABLET | Freq: Four times a day (QID) | ORAL | Status: AC | PRN

## 2012-04-28 NOTE — Telephone Encounter (Signed)
Notified pt, she does not want to continue flexeril as she doesn't feel it is helping and thinks it has been keeping her awake.

## 2012-04-28 NOTE — Telephone Encounter (Signed)
Notified pt, she states she would like to see if the pain resolves on its own and will call us if she decides to proceed with physical therapy. Advised pt to complete the medrol dose pack and take tramadol in place of flexeril.  Pt wanted to know if she should continue the flexeril.   Pt states she thinks something is pressing on her sciatic nerve as she is having pain radiate down her left leg.

## 2012-04-28 NOTE — Telephone Encounter (Signed)
Received message from pt that her back pain has not been relieved by the medrol dose pack and flexeril. Pt wants to know what she should do?  Please advise.

## 2012-04-28 NOTE — Telephone Encounter (Signed)
Ok to continue flexeril but don't take at same time as tramadol as this may cause sleepiness.

## 2012-04-28 NOTE — Telephone Encounter (Signed)
Will send tramadol PRN- use sparingly, may cause drowsiness so don't drive after taking.  I think her pain is related to her back- musculoskeletal.  Physical therapy may help if she would like referral.  Otherwise, MRI of the low back would be the neck step, but I would recommend that she give it a few more weeks to see if it will improve on its own.

## 2012-06-18 ENCOUNTER — Other Ambulatory Visit: Payer: Self-pay | Admitting: Internal Medicine

## 2012-07-17 ENCOUNTER — Telehealth: Payer: Self-pay | Admitting: Internal Medicine

## 2012-07-17 MED ORDER — AMOXICILLIN-POT CLAVULANATE 875-125 MG PO TABS: 1.0000 | ORAL_TABLET | Freq: Two times a day (BID) | ORAL | Status: AC

## 2012-07-17 MED ORDER — FLUCONAZOLE 150 MG PO TABS: 150.0000 mg | ORAL_TABLET | Freq: Once | ORAL | Status: AC

## 2012-07-17 NOTE — Telephone Encounter (Signed)
amox 875mg  po bid x 10d if not allergic and no interactions. Discard toothbrush. Can call in diflucan 150mg  po x one but the creams technically work as well

## 2012-07-17 NOTE — Telephone Encounter (Signed)
Rx[s] to pharmacy; pt informed of MD instructions/SLS

## 2012-07-17 NOTE — Telephone Encounter (Signed)
Patient states that her step daughter tested positive for strep over the weekend and now patient is starting to feel feverish and has a sore throat. She would like to know if Dr. Rodena Medin would call her in an antibiotic? Patient states antibiotics always give her a yeast infection. Could Dr. Rodena Medin call her in something for that? Karin Golden on [address]. Thanks!

## 2012-09-14 ENCOUNTER — Telehealth: Payer: Self-pay | Admitting: Internal Medicine

## 2012-09-14 MED ORDER — VALSARTAN-HYDROCHLOROTHIAZIDE 80-12.5 MG PO TABS: ORAL_TABLET | ORAL | Status: DC

## 2012-09-14 NOTE — Telephone Encounter (Signed)
Done/SLS 

## 2012-10-10 ENCOUNTER — Encounter: Payer: Self-pay | Admitting: Internal Medicine

## 2012-10-10 ENCOUNTER — Ambulatory Visit (INDEPENDENT_AMBULATORY_CARE_PROVIDER_SITE_OTHER): Payer: BC Managed Care – PPO | Admitting: Internal Medicine

## 2012-10-10 VITALS — BP 106/78 | HR 95 | Temp 98.4°F | Resp 16 | Wt 206.0 lb

## 2012-10-10 DIAGNOSIS — Z79899 Other long term (current) drug therapy: Secondary | ICD-10-CM

## 2012-10-10 DIAGNOSIS — M62838 Other muscle spasm: Secondary | ICD-10-CM | POA: Insufficient documentation

## 2012-10-10 DIAGNOSIS — I10 Essential (primary) hypertension: Secondary | ICD-10-CM

## 2012-10-10 DIAGNOSIS — G245 Blepharospasm: Secondary | ICD-10-CM | POA: Insufficient documentation

## 2012-10-10 MED ORDER — METAXALONE 800 MG PO TABS: 800.0000 mg | ORAL_TABLET | Freq: Three times a day (TID) | ORAL | Status: DC

## 2012-10-10 NOTE — Progress Notes (Signed)
  Subjective:    Patient ID: [name], female    DOB: [birth year], 36 y.o.   MRN: [id number]  HPI patient presents to clinic for evaluation of multiple medical complaints. Notes recent 2 week history of right blepharospasm just resolved 2 days ago. Had no initial triggering event. Spontaneously resolved. Has intermittent pulling sensation in the same eyelid but no further twitching. Also notes approximately 2 day history of pain involving left posterior head, left posterior neck, trapezius and left shoulder pain. Appears to be muscular distribution. No radicular pain, paresthesias or weakness. No injury or trauma. Wonders if she slept wrong. Taking Tylenol for the problem without improvement. Avoiding anti-inflammatories at the request of her gastroenterologist. No other alleviating or exacerbating factors. Blood pressure reviewed as normotensive. Declines influenza vaccine  Past Medical History  Diagnosis Date  . Anemia   . Blood in stool   . Chicken pox   . Diverticulitis   . Hypertension   . Colon polyps    Past Surgical History  Procedure Date  . Cesarean section 2006  . Laparoscopic cholecystectomy 2012    reports that she has quit smoking. She has never used smokeless tobacco. She reports that she drinks alcohol. Her drug history not on file. family history includes Cancer in her other; Crohn's disease in her father; Lupus in her father; Stroke in an unspecified family member; and Ulcerative colitis in her father. No Known Allergies   Review of Systems see history of present illness     Objective:   Physical Exam  Nursing note and vitals reviewed. Constitutional: She appears well-developed and well-nourished. No distress.  HENT:  Head: Normocephalic and atraumatic.  Right Ear: External ear normal.  Left Ear: External ear normal.  Eyes: Conjunctivae normal, EOM and lids are normal. Pupils are equal, round, and reactive to light. No scleral icterus.  Neck: Neck supple.    Musculoskeletal:       Range of motion neck. Mild tenderness to palpation left posterior paraspinal neck muscles without overt spasm. Full range of motion left shoulder. Distal left hand MCP and intertriginous 5/5  Neurological: She is alert. She has normal strength. No cranial nerve deficit. Coordination and gait normal.  Skin: She is not diaphoretic.  Psychiatric: She has a normal mood and affect.          Assessment & Plan:

## 2012-10-10 NOTE — Assessment & Plan Note (Signed)
Normotensive and stable. Continue current regimen. Monitor bp as outpt and followup in clinic as scheduled.  

## 2012-10-10 NOTE — Assessment & Plan Note (Signed)
Avoid NSAIDs per GI. Attempt Skelaxin when necessary-caution regarding possible sedating effect

## 2012-10-11 LAB — BASIC METABOLIC PANEL
CO2: 25 mEq/L (ref 19–32)
Calcium: 9.7 mg/dL (ref 8.4–10.5)
Creat: 0.75 mg/dL (ref 0.50–1.10)
Glucose, Bld: 108 mg/dL — ABNORMAL HIGH (ref 70–99)

## 2012-11-05 ENCOUNTER — Other Ambulatory Visit: Payer: Self-pay | Admitting: Family

## 2012-11-06 NOTE — Telephone Encounter (Signed)
Rx to pharmacy/SLS 

## 2012-11-16 ENCOUNTER — Other Ambulatory Visit: Payer: Self-pay | Admitting: Internal Medicine

## 2012-11-16 NOTE — Telephone Encounter (Signed)
Rx to pharmacy/SLS 

## 2012-12-05 ENCOUNTER — Encounter: Payer: Self-pay | Admitting: Family

## 2012-12-05 ENCOUNTER — Ambulatory Visit (INDEPENDENT_AMBULATORY_CARE_PROVIDER_SITE_OTHER): Payer: BC Managed Care – PPO | Admitting: Family

## 2012-12-05 VITALS — BP 130/84 | HR 103 | Temp 98.4°F | Resp 16 | Wt 215.0 lb

## 2012-12-05 DIAGNOSIS — IMO0002 Reserved for concepts with insufficient information to code with codable children: Secondary | ICD-10-CM | POA: Insufficient documentation

## 2012-12-05 DIAGNOSIS — K651 Peritoneal abscess: Secondary | ICD-10-CM

## 2012-12-05 MED ORDER — DOXYCYCLINE HYCLATE 100 MG PO TABS: 100.0000 mg | ORAL_TABLET | Freq: Two times a day (BID) | ORAL | Status: DC

## 2012-12-05 NOTE — Assessment & Plan Note (Signed)
Incision and drainage of wound was performed.  Procedure including risks/benefits explained to patient.  Questions were answered. After informed consent was obtained, site was cleansed with betadine and then alcohol. 1% Lidocaine with epinephrine was injected under lesion and then shave biopsy was performed. Two small incisions were made- unable to express any drainage.  As a result no culture performed.   Pt tolerated procedure well.  Pt to contact us if she develops increased redness, drainage or swelling at the site.  Pt may use tylenol as needed for discomfort today. Rx with Doxy, follow up in 1 week, sooner if symptoms worsen.

## 2012-12-05 NOTE — Patient Instructions (Addendum)
Start doxycycline. Call if you develop fever, increased redness/pain. Follow up with Dr. Rodena Medin in 1 week.

## 2012-12-05 NOTE — Progress Notes (Signed)
  Subjective:    Patient ID: [name], female    DOB: October 31, 1976, 36 y.o.   MRN: [id number]  HPI  [name] is a 36 yr old female who presents today with a 24 hour hx of "bump" on her abdomen.  Bump has been painful, red and swollen.  She denies know hx of MRSA.  Denies fever.     Review of Systems See HPI  Past Medical History  Diagnosis Date  . Anemia   . Blood in stool   . Chicken pox   . Diverticulitis   . Hypertension   . Colon polyps     History   Social History  . Marital Status: Married    Spouse Name: [name]    Number of Children: [name]  . Years of Education: [name]   Occupational History  . Not on file.   Social History Main Topics  . Smoking status: Former Games developer  . Smokeless tobacco: Never Used  . Alcohol Use: Yes  . Drug Use: Not on file  . Sexually Active: Not on file   Other Topics Concern  . Not on file   Social History Narrative  . No narrative on file    Past Surgical History  Procedure Date  . Cesarean section 2006  . Laparoscopic cholecystectomy 2012    Family History  Problem Relation Age of Onset  . Stroke      family hx  . Crohn's disease Father   . Lupus Father   . Ulcerative colitis Father   . Cancer Other     breast    No Known Allergies  Current Outpatient Prescriptions on File Prior to Visit  Medication Sig Dispense Refill  . cetirizine (ZYRTEC) 10 MG tablet Take 10 mg by mouth daily as needed.      . fluticasone (FLONASE) 50 MCG/ACT nasal spray PLACE 2 SPRAYS INTO THE NOSE DAILY.  16 g  4  . levonorgestrel (MIRENA) 20 MCG/24HR IUD 1 each by Intrauterine route once.      . valsartan-hydrochlorothiazide (DIOVAN-HCT) 80-12.5 MG per tablet TAKE 1 TABLET BY MOUTH DAILY.  30 tablet  6    BP 130/84  Pulse 103  Temp 98.4 F (36.9 C) (Oral)  Resp 16  Wt 215 lb 0.6 oz (97.542 kg)  SpO2 98%       Objective:   Physical Exam  Constitutional: She appears well-developed and well-nourished.  Abdominal: Soft.       Firm  tender walnut sized mass epigastric area beneath  Surface.  Small sinus draining foul smelling drainage is noted.  Mild surrounding erythema is noted.            Assessment & Plan:

## 2012-12-27 ENCOUNTER — Telehealth: Payer: Self-pay | Admitting: Internal Medicine

## 2012-12-27 NOTE — Telephone Encounter (Signed)
Patient states that she has a sore throat and her daughter tested positive for strep today. She would like to know if Dr. Rodena Medin would call her in something w/o being seen? Karin Golden on [address]

## 2012-12-28 MED ORDER — AMOXICILLIN-POT CLAVULANATE 875-125 MG PO TABS: 1.0000 | ORAL_TABLET | Freq: Two times a day (BID) | ORAL | Status: DC

## 2012-12-28 NOTE — Telephone Encounter (Signed)
Amoxicillin 875mg  po bid x 10d

## 2012-12-28 NOTE — Telephone Encounter (Signed)
Rx to pharmacy; patient informed/SLS

## 2013-02-07 ENCOUNTER — Ambulatory Visit (INDEPENDENT_AMBULATORY_CARE_PROVIDER_SITE_OTHER): Payer: BC Managed Care – PPO | Admitting: General Surgery

## 2013-02-07 ENCOUNTER — Encounter (INDEPENDENT_AMBULATORY_CARE_PROVIDER_SITE_OTHER): Payer: Self-pay | Admitting: General Surgery

## 2013-02-07 DIAGNOSIS — Z6835 Body mass index (BMI) 35.0-35.9, adult: Secondary | ICD-10-CM

## 2013-02-07 DIAGNOSIS — D649 Anemia, unspecified: Secondary | ICD-10-CM

## 2013-02-07 DIAGNOSIS — I10 Essential (primary) hypertension: Secondary | ICD-10-CM

## 2013-02-07 NOTE — Progress Notes (Signed)
Patient ID: [name], female   DOB: 11/08/1976, 37 y.o.   MRN: [id number]  Chief Complaint  Patient presents with  . Bariatric Pre-op    initial    HPI [name] is a 37 y.o. female.  This patient presents for her initial weight loss surgery evaluation. She has a BMI of 35.48 with obesity related comorbidities of hypertension, hyperlipidemia. She has a tender information session and has struggled with her weight since she stopped playing sports in high school. Since then she has also had 4 children. She has tried several diets and has been able to lose some weight but is always seemed to regain the weight. She had the most success with the abs diet in 2005 where she lost 35 pounds but she regained the weight. She has also tried Weight Watchers and recently joined a gym as well. She denies any history of reflux. She does describe herself is fairly active. HPI   Past Medical History  Diagnosis Date  . Anemia   . Blood in stool   . Chicken pox   . Diverticulitis   . Hypertension   . Colon polyps     Past Surgical History  Procedure Laterality Date  . Cesarean section  2006  . Laparoscopic cholecystectomy  2012    Family History  Problem Relation Age of Onset  . Stroke      family hx  . Crohn's disease Father   . Lupus Father   . Ulcerative colitis Father   . Cancer Other     breast    Social History History  Substance Use Topics  . Smoking status: Former Games developer  . Smokeless tobacco: Never Used  . Alcohol Use: Yes    No Known Allergies  Current Outpatient Prescriptions  Medication Sig Dispense Refill  . amoxicillin-clavulanate (AUGMENTIN) 875-125 MG per tablet Take 1 tablet by mouth 2 (two) times daily.  20 tablet  0  . cetirizine (ZYRTEC) 10 MG tablet Take 10 mg by mouth daily as needed.      . doxycycline (VIBRA-TABS) 100 MG tablet Take 1 tablet (100 mg total) by mouth 2 (two) times daily.  20 tablet  0  . fluticasone (FLONASE) 50 MCG/ACT nasal spray  PLACE 2 SPRAYS INTO THE NOSE DAILY.  16 g  4  . levonorgestrel (MIRENA) 20 MCG/24HR IUD 1 each by Intrauterine route once.      . valsartan-hydrochlorothiazide (DIOVAN-HCT) 80-12.5 MG per tablet TAKE 1 TABLET BY MOUTH DAILY.  30 tablet  6   No current facility-administered medications for this visit.    Review of Systems Review of Systems All other review of systems negative or noncontributory except as stated in the HPI  Blood pressure 112/70, pulse 76, temperature 98.2 F (36.8 C), temperature source Temporal, resp. rate 12, height 5\' 5"  (1.651 m), weight 213 lb 3.2 oz (96.707 kg).  Physical Exam Physical Exam Physical Exam  Nursing note and vitals reviewed. Constitutional: She is oriented to person, place, and time. She appears well-developed and well-nourished. No distress.  HENT:  Head: Normocephalic and atraumatic.  Mouth/Throat: No oropharyngeal exudate.  Eyes: Conjunctivae and EOM are normal. Pupils are equal, round, and reactive to light. Right eye exhibits no discharge. Left eye exhibits no discharge. No scleral icterus.  Neck: Normal range of motion. Neck supple. No tracheal deviation present.  Cardiovascular: Normal rate, regular rhythm, normal heart sounds and intact distal pulses.   Pulmonary/Chest: Effort normal and breath sounds normal. No stridor. No  respiratory distress. She has no wheezes.  Abdominal: Soft. Bowel sounds are normal. She exhibits no distension and no mass. There is no tenderness. There is no rebound and no guarding.  Musculoskeletal: Normal range of motion. She exhibits no edema and no tenderness.  Neurological: She is alert and oriented to person, place, and time.  Skin: Skin is warm and dry. No rash noted. She is not diaphoretic. No erythema. No pallor.  Psychiatric: She has a normal mood and affect. Her behavior is normal. Judgment and thought content normal.    Data Reviewed   Assessment    Morbid obesity with a BMI of 35.48 and obesity  related comorbidities of hypertension and hyperlipidemia I had a long discussion with her regarding the nonsurgical and surgical weight loss options. We discussed the pros and cons of the lap band, a sleeve gastrectomy, and the Roux-en-Y gastric bypass. She remains most interested in the vertical sleeve gastrectomy. We discussed the pros and cons of this procedure as well as the risks as well. The risks of infection, bleeding, pain, scarring, weight regain, too little or too much weight loss, vitamin deficiencies and need for lifelong vitamin supplementation, hair loss, need for protein supplementation, leaks, stricture, reflux, food intolerance, need for reoperation and conversion to roux Y gastric bypass, need for open surgery, injury to spleen or surrounding structures, DVT's, PE, and death again discussed with the patient and the patient expressed understanding and desires to proceed with laparoscopic vertical sleeve gastrectomy, possible open, intraoperative endoscopy.      Plan    We will go ahead and start with her nutrition labs and nutrition in psychology evaluation of an upper GI.        [name] DAVID 02/07/2013, 4:55 PM

## 2013-02-22 ENCOUNTER — Other Ambulatory Visit: Payer: Self-pay

## 2013-02-22 ENCOUNTER — Ambulatory Visit (HOSPITAL_COMMUNITY)
Admission: RE | Admit: 2013-02-22 | Discharge: 2013-02-22 | Disposition: A | Discharge status: Home / Self Care | Payer: BC Managed Care – PPO | Source: Ambulatory Visit | Attending: General Surgery | Admitting: General Surgery

## 2013-02-22 DIAGNOSIS — D649 Anemia, unspecified: Secondary | ICD-10-CM | POA: Insufficient documentation

## 2013-02-22 DIAGNOSIS — Z6835 Body mass index (BMI) 35.0-35.9, adult: Secondary | ICD-10-CM | POA: Insufficient documentation

## 2013-02-22 DIAGNOSIS — E785 Hyperlipidemia, unspecified: Secondary | ICD-10-CM | POA: Insufficient documentation

## 2013-02-22 DIAGNOSIS — I10 Essential (primary) hypertension: Secondary | ICD-10-CM

## 2013-02-22 DIAGNOSIS — D259 Leiomyoma of uterus, unspecified: Secondary | ICD-10-CM | POA: Insufficient documentation

## 2013-02-23 ENCOUNTER — Encounter: Payer: BC Managed Care – PPO | Attending: General Surgery | Admitting: *Deleted

## 2013-02-23 DIAGNOSIS — Z713 Dietary counseling and surveillance: Secondary | ICD-10-CM | POA: Insufficient documentation

## 2013-02-23 LAB — COMPREHENSIVE METABOLIC PANEL
AST: 26 U/L (ref 0–37)
Albumin: 4.5 g/dL (ref 3.5–5.2)
BUN: 9 mg/dL (ref 6–23)
Calcium: 9.4 mg/dL (ref 8.4–10.5)
Chloride: 99 mEq/L (ref 96–112)
Glucose, Bld: 86 mg/dL (ref 70–99)
Potassium: 3.7 mEq/L (ref 3.5–5.3)
Sodium: 134 mEq/L — ABNORMAL LOW (ref 135–145)
Total Protein: 7.2 g/dL (ref 6.0–8.3)

## 2013-02-23 LAB — CBC WITH DIFFERENTIAL/PLATELET
Basophils Absolute: 0.1 10*3/uL (ref 0.0–0.1)
HCT: 40.3 % (ref 36.0–46.0)
Hemoglobin: 13.5 g/dL (ref 12.0–15.0)
Lymphocytes Relative: 33 % (ref 12–46)
Monocytes Absolute: 0.4 10*3/uL (ref 0.1–1.0)
Monocytes Relative: 4 % (ref 3–12)
Neutro Abs: 5.3 10*3/uL (ref 1.7–7.7)
Neutrophils Relative %: 59 % (ref 43–77)
WBC: 8.9 10*3/uL (ref 4.0–10.5)

## 2013-02-23 LAB — LIPID PANEL
HDL: 29 mg/dL — ABNORMAL LOW (ref >39)
Total CHOL/HDL Ratio: 6.2 Ratio

## 2013-02-26 LAB — H. PYLORI ANTIBODY, IGG: H Pylori IgG: 0.4 {ISR}

## 2013-02-27 ENCOUNTER — Encounter: Payer: Self-pay | Admitting: *Deleted

## 2013-02-27 NOTE — Patient Instructions (Addendum)
   Follow Pre-Op Nutrition Goals to prepare for Gastric Sleeve Surgery.   Call the [facility] at [phone] once you have been given your surgery date to enrolled in the Pre-Op Nutrition Class. You will need to attend this nutrition class 3-4 weeks prior to your surgery.  

## 2013-02-27 NOTE — Progress Notes (Signed)
  Pre-Op Assessment Visit:  Pre-Operative Gastric Sleeve Surgery  Medical Nutrition Therapy:  Appt start time: 0900   End time:  1000.  Patient was seen on 02/23/13 for Pre-Operative Gastric Sleeve Nutrition Assessment. Assessment and letter of approval faxed to [facility] Bariatric Surgery Program coordinator on 02/27/13.  Approval letter sent to [facility] and will be available in the chart under the media tab.  TANITA  BODY COMP RESULTS  02/23/13   BMI (kg/m^2) 35.4   Fat Mass (lbs) 96.5   Fat Free Mass (lbs) 116.0   Total Body Water (lbs) 85.0   Handouts given during visit include:  Pre-Op Goals   Bariatric Surgery Protein Shakes  Patient to call for Pre-Op and Post-Op Nutrition Education at the [facility] when surgery is scheduled.

## 2013-03-22 ENCOUNTER — Encounter: Payer: BC Managed Care – PPO | Attending: General Surgery | Admitting: *Deleted

## 2013-03-22 DIAGNOSIS — Z713 Dietary counseling and surveillance: Secondary | ICD-10-CM | POA: Insufficient documentation

## 2013-03-23 ENCOUNTER — Encounter: Payer: Self-pay | Admitting: *Deleted

## 2013-03-23 NOTE — Progress Notes (Signed)
Supervised Weight Loss Visit on 03/22/2013  Patient here from 9:30 to 10:00 AM for Group Supervised Weight Loss Visit Current weight: 212.4 pounds, indicates no weight loss since last visit here on 02/23/2013.  She states behavior changes include: Eating at home more often than eating out to reduce calorie intake from higher fat foods Is reading and researching more about good nutrition to prepare for healthier choices the rest of her life   Plan, continue with eating at home more often and to expand recipes that are healthy and lower in fat. Next visit is scheduled for May 8th, 2014

## 2013-04-06 ENCOUNTER — Ambulatory Visit: Payer: BC Managed Care – PPO | Admitting: Internal Medicine

## 2013-04-09 ENCOUNTER — Encounter: Payer: BC Managed Care – PPO | Admitting: Family Medicine

## 2013-04-09 DIAGNOSIS — Z0289 Encounter for other administrative examinations: Secondary | ICD-10-CM

## 2013-04-19 ENCOUNTER — Encounter: Payer: BC Managed Care – PPO | Attending: General Surgery | Admitting: *Deleted

## 2013-04-19 DIAGNOSIS — Z713 Dietary counseling and surveillance: Secondary | ICD-10-CM | POA: Insufficient documentation

## 2013-04-19 NOTE — Patient Instructions (Addendum)
Goals:  Eat 3 meals/day, Avoid meal skipping   Have lean, protein rich foods with all carbohydrates  Limit carbohydrate 2 servings/meal and 1 serving/snack  Choose more whole grains, lean protein, low-fat dairy, and fruits/non-starchy vegetables.   Aim for >30 min of physical activity daily  Limit sugar-sweetened beverages and concentrated sweets 

## 2013-04-24 ENCOUNTER — Encounter: Payer: Self-pay | Admitting: *Deleted

## 2013-04-24 NOTE — Progress Notes (Signed)
Supervised Weight Loss Visit: Pre-Operative Gastric Sleeve Surgery   Medical Nutrition Therapy: Appt start time: 0940   End time: 1000.   Primary concerns today: Pre-Operative Bariatric Surgery Nutrition Management   Start weight:  212.5 lbs (02/23/13)  Current weight: 214.2 lbs  Total weight loss: 0 BMI: 35.4 kg/m^2   Recent physical activity: Slight increase in exercise   Progress Towards Goal(s): In progress.   Nutritional Diagnosis:  [place]-3.3 Obesity related to past poor dietary habits and physical inactivity as evidenced by patient attending supervised weight loss program for insurance approval of bariatric surgery.   Intervention: Nutrition education including importance of exercise both pre- and post-op bariatric surgery.   Monitoring/Evaluation: Dietary intake, exercise, and body weight. Follow up in 1 month for supervised weight loss visit.  TANITA  BODY COMP RESULTS  02/23/13   BMI (kg/m^2) 35.4   Fat Mass (lbs) 96.5   Fat Free Mass (lbs) 116.0   Total Body Water (lbs) 85.0

## 2013-05-22 ENCOUNTER — Ambulatory Visit (INDEPENDENT_AMBULATORY_CARE_PROVIDER_SITE_OTHER): Payer: BC Managed Care – PPO | Admitting: Family

## 2013-05-22 ENCOUNTER — Encounter: Payer: Self-pay | Admitting: Family

## 2013-05-22 VITALS — BP 120/88 | HR 85 | Temp 98.1°F | Resp 16 | Wt 213.1 lb

## 2013-05-22 DIAGNOSIS — F411 Generalized anxiety disorder: Secondary | ICD-10-CM

## 2013-05-22 NOTE — Progress Notes (Signed)
  Subjective:    Patient ID: [name], female    DOB: 1976-10-11, 37 y.o.   MRN: [id number]  HPI  [name] is a 37 yr old female who presents today to discuss anxiety. She describes that she has always struggled with mild anxiety (only bothered her a few times a month).  She was generally able manage this anxiety.  She was treated with medication for anxiety about 8 years ago during her divorce.  She was on medication x 3 years and found it very difficult to come off of the medication, hence she has put off seeking treatment most recently.   She reports that in February of this year she switched jobs.  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  Reports episodes of feeling overwhelmed, heart racing and panicky.   Review of Systems See HPI     Objective:   Physical Exam  Constitutional: She appears well-developed. No distress.  Psychiatric: She has a normal mood and affect. Her behavior is normal. Judgment and thought content normal.          Assessment & Plan:

## 2013-05-22 NOTE — Assessment & Plan Note (Signed)
This seems largely situational. We discussed trying to scale back on her work responsibilities and increasing exercise for stress management.  If symptoms worsen, or if no improvement with these changes consider addition or SSRI.  Pt is agreeable to this plan.  15 minutes spent with pt today. >50% of this time was spent counseling pt on anxiety and stress management.

## 2013-05-22 NOTE — Patient Instructions (Addendum)
Please schedule a follow up appointment in 3 months.

## 2013-05-24 ENCOUNTER — Encounter: Payer: Self-pay | Admitting: *Deleted

## 2013-05-24 ENCOUNTER — Encounter: Payer: BC Managed Care – PPO | Attending: General Surgery | Admitting: *Deleted

## 2013-05-24 DIAGNOSIS — Z713 Dietary counseling and surveillance: Secondary | ICD-10-CM | POA: Insufficient documentation

## 2013-06-05 NOTE — Progress Notes (Signed)
Need orders in EPIC.  Surgery scheduled for 06/19/13.  Preop appointment on 6/30 14 at 2pm. Thank You.

## 2013-06-06 ENCOUNTER — Encounter (HOSPITAL_COMMUNITY): Payer: Self-pay | Admitting: Pharmacy Technician

## 2013-06-07 ENCOUNTER — Encounter: Payer: BC Managed Care – PPO | Admitting: *Deleted

## 2013-06-07 ENCOUNTER — Other Ambulatory Visit (INDEPENDENT_AMBULATORY_CARE_PROVIDER_SITE_OTHER): Payer: Self-pay | Admitting: General Surgery

## 2013-06-08 ENCOUNTER — Other Ambulatory Visit: Payer: Self-pay | Admitting: Internal Medicine

## 2013-06-11 ENCOUNTER — Encounter (HOSPITAL_COMMUNITY): Payer: Self-pay

## 2013-06-11 ENCOUNTER — Ambulatory Visit (HOSPITAL_COMMUNITY)
Admission: RE | Admit: 2013-06-11 | Discharge: 2013-06-11 | Disposition: A | Discharge status: Home / Self Care | Payer: BC Managed Care – PPO | Source: Ambulatory Visit | Attending: General Surgery | Admitting: General Surgery

## 2013-06-11 ENCOUNTER — Encounter (HOSPITAL_COMMUNITY)
Admission: RE | Admit: 2013-06-11 | Discharge: 2013-06-11 | Disposition: A | Discharge status: Home / Self Care | Payer: BC Managed Care – PPO | Source: Ambulatory Visit | Attending: General Surgery | Admitting: General Surgery

## 2013-06-11 DIAGNOSIS — Z01818 Encounter for other preprocedural examination: Secondary | ICD-10-CM | POA: Insufficient documentation

## 2013-06-11 DIAGNOSIS — Z9089 Acquired absence of other organs: Secondary | ICD-10-CM | POA: Insufficient documentation

## 2013-06-11 LAB — COMPREHENSIVE METABOLIC PANEL
Albumin: 4.2 g/dL (ref 3.5–5.2)
Alkaline Phosphatase: 77 U/L (ref 39–117)
BUN: 11 mg/dL (ref 6–23)
CO2: 24 mEq/L (ref 19–32)
Chloride: 100 mEq/L (ref 96–112)
Creatinine, Ser: 0.77 mg/dL (ref 0.50–1.10)
GFR calc non Af Amer: >90 mL/min (ref >90)
Glucose, Bld: 86 mg/dL (ref 70–99)
Potassium: 3.7 mEq/L (ref 3.5–5.1)
Total Bilirubin: 0.2 mg/dL — ABNORMAL LOW (ref 0.3–1.2)

## 2013-06-11 LAB — CBC WITH DIFFERENTIAL/PLATELET
Basophils Relative: 1 % (ref 0–1)
HCT: 38.3 % (ref 36.0–46.0)
Hemoglobin: 12.8 g/dL (ref 12.0–15.0)
Lymphocytes Relative: 41 % (ref 12–46)
Lymphs Abs: 4 10*3/uL (ref 0.7–4.0)
MCHC: 33.4 g/dL (ref 30.0–36.0)
Monocytes Absolute: 0.5 10*3/uL (ref 0.1–1.0)
Monocytes Relative: 6 % (ref 3–12)
Neutro Abs: 5 10*3/uL (ref 1.7–7.7)
Neutrophils Relative %: 50 % (ref 43–77)
RBC: 4.74 MIL/uL (ref 3.87–5.11)

## 2013-06-11 LAB — HCG, SERUM, QUALITATIVE: Preg, Serum: NEGATIVE

## 2013-06-11 NOTE — Telephone Encounter (Signed)
Rx request to pharmacy/[facility]  

## 2013-06-11 NOTE — Patient Instructions (Addendum)
20 ARITA SEVERTSON  06/11/2013   Your procedure is scheduled on:  06/19/13  TUESDAY  Report to [facility] at  0515     AM.  Call this number if you have problems the morning of surgery: [phone]       Remember:   Do not eat food  Or drink :After Midnight. Monday NIGHT   Take these medicines the morning of surgery with A SIP OF WATER:NONE   .  Contacts, dentures or partial plates can not be worn to surgery  Leave suitcase in the car. After surgery it may be brought to your room.  For patients admitted to the hospital, checkout time is 11:00 AM day of  discharge.             SPECIAL INSTRUCTIONS- SEE [place] PREPARING FOR SURGERY INSTRUCTION SHEET-     DO NOT WEAR JEWELRY, LOTIONS, POWDERS, OR PERFUMES.  WOMEN-- DO NOT SHAVE LEGS OR UNDERARMS FOR 12 HOURS BEFORE SHOWERS. MEN MAY SHAVE FACE.  Patients discharged the day of surgery will not be allowed to drive home. IF going home the day of surgery, you must have a driver and someone to stay with you for the first 24 hours  Name and phone number of your driver:     ADMISSION                                                                                                                                                     Carolyna Yerian  PST [phone]                 FAILURE TO FOLLOW THESE INSTRUCTIONS MAY RESULT IN  CANCELLATION   OF YOUR SURGERY                                                  Patient Signature _____________________________

## 2013-06-11 NOTE — Progress Notes (Signed)
EKG 3/14 EPIC

## 2013-06-14 ENCOUNTER — Ambulatory Visit (INDEPENDENT_AMBULATORY_CARE_PROVIDER_SITE_OTHER): Payer: BC Managed Care – PPO | Admitting: General Surgery

## 2013-06-14 ENCOUNTER — Encounter (INDEPENDENT_AMBULATORY_CARE_PROVIDER_SITE_OTHER): Payer: Self-pay | Admitting: General Surgery

## 2013-06-14 DIAGNOSIS — I10 Essential (primary) hypertension: Secondary | ICD-10-CM

## 2013-06-14 DIAGNOSIS — Z6834 Body mass index (BMI) 34.0-34.9, adult: Secondary | ICD-10-CM

## 2013-06-14 DIAGNOSIS — E785 Hyperlipidemia, unspecified: Secondary | ICD-10-CM

## 2013-06-14 NOTE — Progress Notes (Signed)
Patient ID: [name], female   DOB: 01-28-1976, 37 y.o.   MRN: [id number]  Chief Complaint  Patient presents with  . Bariatric Pre-op    sleeve     HPI [name] is a 37 y.o. female. This patient presents for preoperative surgery evaluation for vertical sleeve gastrectomy next week. She has a BMI of 34.7 with obesity related comorbidities of hypertension and hyperlipidemia. She has completed her preoperative surgery evaluation and remains motivated for vertical sleeve gastrectomy. HPI  Past Medical History  Diagnosis Date  . Anemia   . Blood in stool   . Chicken pox   . Diverticulitis   . Hypertension   . Colon polyps   . Obesity     Past Surgical History  Procedure Laterality Date  . Cesarean section  2006  . Laparoscopic cholecystectomy  2012    Family History  Problem Relation Age of Onset  . Stroke      family hx  . Crohn's disease Father   . Lupus Father   . Ulcerative colitis Father   . Cancer Other     breast    Social History History  Substance Use Topics  . Smoking status: Former Smoker -- 0.25 packs/day for 2 years    Types: Cigarettes    Quit date: 06/11/1996  . Smokeless tobacco: Never Used  . Alcohol Use: Yes     Comment: 2-3 mixed drinks week- none x 1 month    No Known Allergies  Current Outpatient Prescriptions  Medication Sig Dispense Refill  . acetaminophen (TYLENOL) 500 MG tablet Take 1,500 mg by mouth every 6 (six) hours as needed (For headache.).      Marland Kitchen cetirizine (ZYRTEC) 10 MG tablet Take 10 mg by mouth daily as needed for allergies.       . flintstones complete (FLINTSTONES) 60 MG chewable tablet Chew 2 tablets by mouth every morning.      Marland Kitchen levonorgestrel (MIRENA) 20 MCG/24HR IUD 1 each by Intrauterine route once.      Marland Kitchen OVER THE COUNTER MEDICATION Take 1 capsule by mouth every morning. Ultra Flora IB      . valsartan-hydrochlorothiazide (DIOVAN-HCT) 80-12.5 MG per tablet TAKE 1 TABLET BY MOUTH DAILY.  30 tablet  2   No  current facility-administered medications for this visit.    Review of Systems Review of Systems All other review of systems negative or noncontributory except as stated in the HPI  Blood pressure 110/72, pulse 102, temperature 97.5 F (36.4 C), temperature source Temporal, height 5\' 5"  (1.651 m), weight 208 lb 9.6 oz (94.62 kg), SpO2 98.00%.  Physical Exam Physical Exam Physical Exam  Nursing note and vitals reviewed. Constitutional: She is oriented to person, place, and time. She appears well-developed and well-nourished. No distress.  HENT:  Head: Normocephalic and atraumatic.  Mouth/Throat: No oropharyngeal exudate.  Eyes: Conjunctivae and EOM are normal. Pupils are equal, round, and reactive to light. Right eye exhibits no discharge. Left eye exhibits no discharge. No scleral icterus.  Neck: Normal range of motion. Neck supple. No tracheal deviation present.  Cardiovascular: Normal rate, regular rhythm, normal heart sounds and intact distal pulses.   Pulmonary/Chest: Effort normal and breath sounds normal. No stridor. No respiratory distress. She has no wheezes.  Abdominal: Soft. Bowel sounds are normal. She exhibits no distension and no mass. There is no tenderness. There is no rebound and no guarding.  Musculoskeletal: Normal range of motion. She exhibits no edema and no tenderness.  Neurological: She is alert and oriented to person, place, and time.  Skin: Skin is warm and dry. No rash noted. She is not diaphoretic. No erythema. No pallor.  Psychiatric: She has a normal mood and affect. Her behavior is normal. Judgment and thought content normal.    Data Reviewed   Assessment    Morbid obesity with a BMI of 34.7 and comorbidities of hypertension and hyperlipidemia We had a discussion about the surgery and its risks and she remains interested in vertical sleeve gastrectomy.The risks of infection, bleeding, pain, scarring, weight regain, too little or too much weight loss,  vitamin deficiencies and need for lifelong vitamin supplementation, hair loss, need for protein supplementation, leaks, stricture, reflux, food intolerance, need for reoperation and conversion to roux Y gastric bypass, need for open surgery, injury to spleen or surrounding structures, DVT's, PE, and death again discussed with the patient and the patient expressed understanding and desires to proceed with laparoscopic vertical sleeve gastrectomy, possible open, intraoperative endoscopy. We discussed the preoperative care of and hospital course as well.     Plan    We will plan for vertical sleeve gastrectomy next week.        Lodema Pilot DAVID 06/14/2013, 5:02 PM

## 2013-06-18 MED ORDER — ERTAPENEM SODIUM 1 G IJ SOLR
1.0000 g | INTRAMUSCULAR | Status: AC
  Administered 2013-06-19: 1 g via INTRAVENOUS
  Filled 2013-06-18: qty 1

## 2013-06-19 ENCOUNTER — Inpatient Hospital Stay (HOSPITAL_COMMUNITY)
Admission: RE | Admit: 2013-06-19 | Discharge: 2013-06-21 | DRG: 293 | Disposition: A | Discharge status: Home / Self Care | Payer: BC Managed Care – PPO | Source: Ambulatory Visit | Attending: General Surgery | Admitting: General Surgery

## 2013-06-19 ENCOUNTER — Encounter (HOSPITAL_COMMUNITY)
Admission: RE | Disposition: A | Discharge status: Home / Self Care | Payer: Self-pay | Source: Ambulatory Visit | Attending: General Surgery

## 2013-06-19 ENCOUNTER — Encounter (HOSPITAL_COMMUNITY): Payer: Self-pay | Admitting: *Deleted

## 2013-06-19 ENCOUNTER — Encounter (HOSPITAL_COMMUNITY): Payer: Self-pay | Admitting: Anesthesiology

## 2013-06-19 ENCOUNTER — Inpatient Hospital Stay (HOSPITAL_COMMUNITY): Payer: BC Managed Care – PPO | Admitting: Anesthesiology

## 2013-06-19 DIAGNOSIS — E785 Hyperlipidemia, unspecified: Secondary | ICD-10-CM | POA: Diagnosis present

## 2013-06-19 DIAGNOSIS — Z79899 Other long term (current) drug therapy: Secondary | ICD-10-CM

## 2013-06-19 DIAGNOSIS — Z8719 Personal history of other diseases of the digestive system: Secondary | ICD-10-CM

## 2013-06-19 DIAGNOSIS — Z6834 Body mass index (BMI) 34.0-34.9, adult: Secondary | ICD-10-CM

## 2013-06-19 DIAGNOSIS — Z860100 Personal history of colon polyps, unspecified: Secondary | ICD-10-CM

## 2013-06-19 DIAGNOSIS — I10 Essential (primary) hypertension: Secondary | ICD-10-CM | POA: Diagnosis present

## 2013-06-19 HISTORY — PX: ESOPHAGOGASTRODUODENOSCOPY: SHX5428

## 2013-06-19 HISTORY — PX: LAPAROSCOPIC GASTRIC SLEEVE RESECTION: SHX5895

## 2013-06-19 SURGERY — GASTRECTOMY, SLEEVE, LAPAROSCOPIC
Anesthesia: General | Site: Esophagus | Wound class: Clean Contaminated

## 2013-06-19 MED ORDER — KETOROLAC TROMETHAMINE 30 MG/ML IJ SOLN
30.0000 mg | Freq: Four times a day (QID) | INTRAMUSCULAR | Status: AC | PRN
  Administered 2013-06-19 – 2013-06-20 (×3): 30 mg via INTRAVENOUS
  Filled 2013-06-19 (×3): qty 1

## 2013-06-19 MED ORDER — ENOXAPARIN SODIUM 40 MG/0.4ML ~~LOC~~ SOLN
40.0000 mg | Freq: Two times a day (BID) | SUBCUTANEOUS | Status: DC
  Administered 2013-06-20 – 2013-06-21 (×3): 40 mg via SUBCUTANEOUS
  Filled 2013-06-19 (×5): qty 0.4

## 2013-06-19 MED ORDER — ONDANSETRON HCL 4 MG/2ML IJ SOLN
INTRAMUSCULAR | Status: DC | PRN
  Administered 2013-06-19: 4 mg via INTRAVENOUS

## 2013-06-19 MED ORDER — HEPARIN SODIUM (PORCINE) 5000 UNIT/ML IJ SOLN
5000.0000 [IU] | Freq: Once | INTRAMUSCULAR | Status: AC
  Administered 2013-06-19: 5000 [IU] via SUBCUTANEOUS
  Filled 2013-06-19: qty 1

## 2013-06-19 MED ORDER — HYDROMORPHONE HCL PF 1 MG/ML IJ SOLN: 0.2500 mg | INTRAMUSCULAR | Status: DC | PRN

## 2013-06-19 MED ORDER — MORPHINE SULFATE 2 MG/ML IJ SOLN
INTRAMUSCULAR | Status: AC
  Administered 2013-06-19: 2 mg via INTRAVENOUS
  Filled 2013-06-19: qty 1

## 2013-06-19 MED ORDER — MIDAZOLAM HCL 5 MG/5ML IJ SOLN
INTRAMUSCULAR | Status: DC | PRN
  Administered 2013-06-19: 2 mg via INTRAVENOUS

## 2013-06-19 MED ORDER — LACTATED RINGERS IR SOLN
Status: DC | PRN
  Administered 2013-06-19: 3000 mL

## 2013-06-19 MED ORDER — GLYCOPYRROLATE 0.2 MG/ML IJ SOLN
INTRAMUSCULAR | Status: DC | PRN
  Administered 2013-06-19: .7 mg via INTRAVENOUS

## 2013-06-19 MED ORDER — BUPIVACAINE HCL 0.25 % IJ SOLN
INTRAMUSCULAR | Status: DC | PRN
  Administered 2013-06-19: 10 mL
  Administered 2013-06-19: 20 mL

## 2013-06-19 MED ORDER — PROMETHAZINE HCL 25 MG/ML IJ SOLN: 6.2500 mg | INTRAMUSCULAR | Status: DC | PRN

## 2013-06-19 MED ORDER — UNJURY CHOCOLATE CLASSIC POWDER: 2.0000 [oz_av] | Freq: Four times a day (QID) | ORAL | Status: DC

## 2013-06-19 MED ORDER — FENTANYL CITRATE 0.05 MG/ML IJ SOLN
INTRAMUSCULAR | Status: DC | PRN
  Administered 2013-06-19: 50 ug via INTRAVENOUS
  Administered 2013-06-19: 100 ug via INTRAVENOUS
  Administered 2013-06-19 (×2): 50 ug via INTRAVENOUS

## 2013-06-19 MED ORDER — NEOSTIGMINE METHYLSULFATE 1 MG/ML IJ SOLN
INTRAMUSCULAR | Status: DC | PRN
  Administered 2013-06-19: 4 mg via INTRAVENOUS

## 2013-06-19 MED ORDER — PROPOFOL 10 MG/ML IV BOLUS
INTRAVENOUS | Status: DC | PRN
  Administered 2013-06-19: 150 mg via INTRAVENOUS

## 2013-06-19 MED ORDER — LACTATED RINGERS IV SOLN
INTRAVENOUS | Status: DC
Start: 2013-06-19 — End: 2013-06-19

## 2013-06-19 MED ORDER — MORPHINE SULFATE 2 MG/ML IJ SOLN
2.0000 mg | INTRAMUSCULAR | Status: DC | PRN
  Administered 2013-06-19: 2 mg via INTRAVENOUS
  Administered 2013-06-19 (×2): 4 mg via INTRAVENOUS
  Filled 2013-06-19: qty 2
  Filled 2013-06-19: qty 1
  Filled 2013-06-19 (×2): qty 2

## 2013-06-19 MED ORDER — LIDOCAINE HCL (CARDIAC) 20 MG/ML IV SOLN
INTRAVENOUS | Status: DC | PRN
  Administered 2013-06-19: 50 mg via INTRAVENOUS

## 2013-06-19 MED ORDER — LACTATED RINGERS IV SOLN
INTRAVENOUS | Status: DC | PRN
  Administered 2013-06-19 (×2): via INTRAVENOUS

## 2013-06-19 MED ORDER — KCL IN DEXTROSE-NACL 20-5-0.45 MEQ/L-%-% IV SOLN
INTRAVENOUS | Status: DC
  Administered 2013-06-19 (×2): via INTRAVENOUS
  Filled 2013-06-19 (×4): qty 1000

## 2013-06-19 MED ORDER — TISSEEL VH 10 ML EX KIT
PACK | CUTANEOUS | Status: DC | PRN
  Administered 2013-06-19: 10 mL

## 2013-06-19 MED ORDER — HYDROMORPHONE HCL PF 1 MG/ML IJ SOLN
INTRAMUSCULAR | Status: DC | PRN
  Administered 2013-06-19: 0.5 mg via INTRAVENOUS
  Administered 2013-06-19: 1 mg via INTRAVENOUS
  Administered 2013-06-19: 0.5 mg via INTRAVENOUS

## 2013-06-19 MED ORDER — ONDANSETRON HCL 4 MG/2ML IJ SOLN
4.0000 mg | INTRAMUSCULAR | Status: DC | PRN
  Filled 2013-06-19: qty 2

## 2013-06-19 MED ORDER — ROCURONIUM BROMIDE 100 MG/10ML IV SOLN
INTRAVENOUS | Status: DC | PRN
  Administered 2013-06-19 (×2): 10 mg via INTRAVENOUS
  Administered 2013-06-19: 40 mg via INTRAVENOUS

## 2013-06-19 MED ORDER — LIDOCAINE-EPINEPHRINE 1 %-1:100000 IJ SOLN
INTRAMUSCULAR | Status: DC | PRN
  Administered 2013-06-19: 20 mL
  Administered 2013-06-19: 10 mL

## 2013-06-19 MED ORDER — UNJURY VANILLA POWDER
2.0000 [oz_av] | Freq: Four times a day (QID) | ORAL | Status: DC
  Administered 2013-06-21: 2 [oz_av] via ORAL

## 2013-06-19 MED ORDER — LACTATED RINGERS IV SOLN: INTRAVENOUS | Status: DC

## 2013-06-19 MED ORDER — ACETAMINOPHEN 160 MG/5ML PO SOLN: 650.0000 mg | ORAL | Status: DC | PRN

## 2013-06-19 MED ORDER — UNJURY CHICKEN SOUP POWDER: 2.0000 [oz_av] | Freq: Four times a day (QID) | ORAL | Status: DC

## 2013-06-19 MED ORDER — OXYCODONE-ACETAMINOPHEN 5-325 MG/5ML PO SOLN
5.0000 mL | ORAL | Status: DC | PRN
  Administered 2013-06-20 (×3): 5 mL via ORAL
  Administered 2013-06-20: 10 mL via ORAL
  Administered 2013-06-21 (×2): 5 mL via ORAL
  Filled 2013-06-19 (×4): qty 5
  Filled 2013-06-19: qty 10

## 2013-06-19 SURGICAL SUPPLY — 48 items
APPLICATOR COTTON TIP 6IN STRL (MISCELLANEOUS) IMPLANT
APPLIER CLIP ROT 10 11.4 M/L (STAPLE)
CABLE HIGH FREQUENCY MONO STRZ (ELECTRODE) IMPLANT
CANISTER SUCTION 2500CC (MISCELLANEOUS) ×6 IMPLANT
CHLORAPREP W/TINT 26ML (MISCELLANEOUS) ×6 IMPLANT
CLIP APPLIE ROT 10 11.4 M/L (STAPLE) IMPLANT
CLOTH BEACON ORANGE TIMEOUT ST (SAFETY) ×3 IMPLANT
DERMABOND ADVANCED (GAUZE/BANDAGES/DRESSINGS) ×1
DERMABOND ADVANCED .7 DNX12 (GAUZE/BANDAGES/DRESSINGS) ×2 IMPLANT
DEVICE SUTURE ENDOST 10MM (ENDOMECHANICALS) IMPLANT
DRAIN CHANNEL 19F RND (DRAIN) ×3 IMPLANT
DRAPE LAPAROSCOPIC ABDOMINAL (DRAPES) ×3 IMPLANT
DRAPE UTILITY XL STRL (DRAPES) ×6 IMPLANT
ELECT REM PT RETURN 9FT ADLT (ELECTROSURGICAL) ×3
ELECTRODE REM PT RTRN 9FT ADLT (ELECTROSURGICAL) ×2 IMPLANT
EVACUATOR SILICONE 100CC (DRAIN) ×3 IMPLANT
GLOVE SURG SS PI 7.5 STRL IVOR (GLOVE) ×9 IMPLANT
GOWN STRL NON-REIN LRG LVL3 (GOWN DISPOSABLE) IMPLANT
GOWN STRL REIN XL XLG (GOWN DISPOSABLE) ×15 IMPLANT
HANDLE STAPLE EGIA 4 XL (STAPLE) ×3 IMPLANT
HOVERMATT SINGLE USE (MISCELLANEOUS) ×3 IMPLANT
KIT BASIN OR (CUSTOM PROCEDURE TRAY) ×3 IMPLANT
MARKER SKIN DUAL TIP RULER LAB (MISCELLANEOUS) ×3 IMPLANT
NEEDLE SPNL 22GX3.5 QUINCKE BK (NEEDLE) IMPLANT
NS IRRIG 1000ML POUR BTL (IV SOLUTION) ×3 IMPLANT
PENCIL BUTTON HOLSTER BLD 10FT (ELECTRODE) ×3 IMPLANT
POUCH SPECIMEN RETRIEVAL 10MM (ENDOMECHANICALS) ×3 IMPLANT
RELOAD EGIA 60 MED/THCK PURPLE (STAPLE) ×6 IMPLANT
RELOAD TRI 2.0 60 XTHK VAS SUL (STAPLE) ×9 IMPLANT
SCISSORS LAP 5X35 DISP (ENDOMECHANICALS) IMPLANT
SEALANT SURGICAL APPL DUAL CAN (MISCELLANEOUS) ×3 IMPLANT
SET IRRIG TUBING LAPAROSCOPIC (IRRIGATION / IRRIGATOR) ×3 IMPLANT
SHEARS CURVED HARMONIC AC 45CM (MISCELLANEOUS) ×3 IMPLANT
SLEEVE XCEL OPT CAN 5 100 (ENDOMECHANICALS) ×6 IMPLANT
SOLUTION ANTI FOG 6CC (MISCELLANEOUS) ×3 IMPLANT
SPONGE GAUZE 4X4 12PLY (GAUZE/BANDAGES/DRESSINGS) ×3 IMPLANT
SPONGE LAP 18X18 X RAY DECT (DISPOSABLE) ×3 IMPLANT
SUT ETHILON 2 0 PS N (SUTURE) ×3 IMPLANT
SUT MNCRL AB 4-0 PS2 18 (SUTURE) ×6 IMPLANT
SUT VICRYL 0 UR6 27IN ABS (SUTURE) ×3 IMPLANT
SYR 50ML LL SCALE MARK (SYRINGE) ×3 IMPLANT
TRAY FOLEY CATH 14FRSI W/METER (CATHETERS) ×3 IMPLANT
TRAY LAP CHOLE (CUSTOM PROCEDURE TRAY) ×3 IMPLANT
TROCAR BLADELESS 15MM (ENDOMECHANICALS) ×3 IMPLANT
TROCAR BLADELESS OPT 5 100 (ENDOMECHANICALS) ×3 IMPLANT
TUBING CONNECTING 10 (TUBING) ×3 IMPLANT
TUBING ENDO SMARTCAP (MISCELLANEOUS) ×3 IMPLANT
TUBING FILTER THERMOFLATOR (ELECTROSURGICAL) ×3 IMPLANT

## 2013-06-19 NOTE — Brief Op Note (Signed)
06/19/2013  9:23 AM  PATIENT:  [name]  37 y.o. female  PRE-OPERATIVE DIAGNOSIS:  morbid obesity  POST-OPERATIVE DIAGNOSIS:  morbid obesity  PROCEDURE:  Procedure(s) with comments: LARAPROSCOPIC SLEEVE GASTRECTOMY WITH EGD ([name]) - LARAPROSCOPIC SLEEVE GASTRECTOMY WITH EGD  ESOPHAGOGASTRODUODENOSCOPY (EGD) ([name])  SURGEON:  Surgeon(s) and Role:    * Lodema Pilot, DO - Primary    * Valarie Merino, MD - Assisting  PHYSICIAN ASSISTANT:   ASSISTANTS: Martin   ANESTHESIA:   general  EBL:  Total I/O In: -  Out: 250 [Urine:200; Blood:50]  BLOOD ADMINISTERED:none  DRAINS: (72F) Jackson-Pratt drain(s) with closed bulb suction in the sleeve staple line   LOCAL MEDICATIONS USED:  MARCAINE    and LIDOCAINE   SPECIMEN:  Source of Specimen:  greater curve of stomach  DISPOSITION OF SPECIMEN:  PATHOLOGY  COUNTS:  YES  TOURNIQUET:  * No tourniquets in log *  DICTATION: .Other Dictation: Dictation Number [id number]  PLAN OF CARE: Admit to inpatient   PATIENT DISPOSITION:  PACU - hemodynamically stable.   Delay start of Pharmacological VTE agent (>24hrs) due to surgical blood loss or risk of bleeding: no

## 2013-06-19 NOTE — Anesthesia Postprocedure Evaluation (Signed)
Anesthesia Post Note  Patient: [name]  Procedure(s) Performed: Procedure(s) (LRB): LARAPROSCOPIC SLEEVE GASTRECTOMY WITH EGD ([name]) ESOPHAGOGASTRODUODENOSCOPY (EGD) ([name])  Anesthesia type: MAC  Patient location: PACU  Post pain: Pain level controlled  Post assessment: Post-op Vital signs reviewed  Last Vitals:  Filed Vitals:   06/19/13 1235  BP: 141/91  Pulse: 101  Temp: 36.6 C  Resp: 18    Post vital signs: Reviewed  Level of consciousness: sedated  Complications: No apparent anesthesia complications

## 2013-06-19 NOTE — Anesthesia Preprocedure Evaluation (Signed)
Anesthesia Evaluation    Airway Mallampati: II TM Distance: >3 FB Neck ROM: Full    Dental  (+) Teeth Intact and Dental Advisory Given   Pulmonary former smoker,  breath sounds clear to auscultation  Pulmonary exam normal       Cardiovascular hypertension, Pt. on medications Rhythm:Regular Rate:Normal     Neuro/Psych  Neuromuscular disease    GI/Hepatic   Endo/Other  Morbid obesity  Renal/GU      Musculoskeletal   Abdominal   Peds  Hematology   Anesthesia Other Findings   Reproductive/Obstetrics                           Anesthesia Physical Anesthesia Plan  ASA: II  Anesthesia Plan: General   Post-op Pain Management:    Induction: Intravenous  Airway Management Planned: Oral ETT  Additional Equipment:   Intra-op Plan:   Post-operative Plan: Extubation in OR  Informed Consent: I have reviewed the patients History and Physical, chart, labs and discussed the procedure including the risks, benefits and alternatives for the proposed anesthesia with the patient or authorized representative who has indicated his/her understanding and acceptance.   Dental advisory given  Plan Discussed with: CRNA  Anesthesia Plan Comments:         Anesthesia Quick Evaluation

## 2013-06-19 NOTE — Transfer of Care (Signed)
Immediate Anesthesia Transfer of Care Note  Patient: [name]  Procedure(s) Performed: Procedure(s) (LRB): LARAPROSCOPIC SLEEVE GASTRECTOMY WITH EGD ([name]) ESOPHAGOGASTRODUODENOSCOPY (EGD) ([name])  Patient Location: PACU  Anesthesia Type: General  Level of Consciousness: sedated, patient cooperative and responds to stimulaton  Airway & Oxygen Therapy: Patient Spontanous Breathing and Patient connected to face mask oxgen  Post-op Assessment: Report given to PACU RN and Post -op Vital signs reviewed and stable  Post vital signs: Reviewed and stable  Complications: No apparent anesthesia complications

## 2013-06-19 NOTE — H&P (View-Only) (Signed)
Patient ID: [name], female   DOB: 07/15/1976, 36 y.o.   MRN: [id number]  Chief Complaint  Patient presents with  . Bariatric Pre-op    sleeve     HPI [name] is a 36 y.o. female. This patient presents for preoperative surgery evaluation for vertical sleeve gastrectomy next week. She has a BMI of 34.7 with obesity related comorbidities of hypertension and hyperlipidemia. She has completed her preoperative surgery evaluation and remains motivated for vertical sleeve gastrectomy. HPI  Past Medical History  Diagnosis Date  . Anemia   . Blood in stool   . Chicken pox   . Diverticulitis   . Hypertension   . Colon polyps   . Obesity     Past Surgical History  Procedure Laterality Date  . Cesarean section  2006  . Laparoscopic cholecystectomy  2012    Family History  Problem Relation Age of Onset  . Stroke      family hx  . Crohn's disease Father   . Lupus Father   . Ulcerative colitis Father   . Cancer Other     breast    Social History History  Substance Use Topics  . Smoking status: Former Smoker -- 0.25 packs/day for 2 years    Types: Cigarettes    Quit date: 06/11/1996  . Smokeless tobacco: Never Used  . Alcohol Use: Yes     Comment: 2-3 mixed drinks week- none x 1 month    No Known Allergies  Current Outpatient Prescriptions  Medication Sig Dispense Refill  . acetaminophen (TYLENOL) 500 MG tablet Take 1,500 mg by mouth every 6 (six) hours as needed (For headache.).      . cetirizine (ZYRTEC) 10 MG tablet Take 10 mg by mouth daily as needed for allergies.       . flintstones complete (FLINTSTONES) 60 MG chewable tablet Chew 2 tablets by mouth every morning.      . levonorgestrel (MIRENA) 20 MCG/24HR IUD 1 each by Intrauterine route once.      . OVER THE COUNTER MEDICATION Take 1 capsule by mouth every morning. Ultra Flora IB      . valsartan-hydrochlorothiazide (DIOVAN-HCT) 80-12.5 MG per tablet TAKE 1 TABLET BY MOUTH DAILY.  30 tablet  2   No  current facility-administered medications for this visit.    Review of Systems Review of Systems All other review of systems negative or noncontributory except as stated in the HPI  Blood pressure 110/72, pulse 102, temperature 97.5 F (36.4 C), temperature source Temporal, height 5' 5" (1.651 m), weight 208 lb 9.6 oz (94.62 kg), SpO2 98.00%.  Physical Exam Physical Exam Physical Exam  Nursing note and vitals reviewed. Constitutional: She is oriented to person, place, and time. She appears well-developed and well-nourished. No distress.  HENT:  Head: Normocephalic and atraumatic.  Mouth/Throat: No oropharyngeal exudate.  Eyes: Conjunctivae and EOM are normal. Pupils are equal, round, and reactive to light. Right eye exhibits no discharge. Left eye exhibits no discharge. No scleral icterus.  Neck: Normal range of motion. Neck supple. No tracheal deviation present.  Cardiovascular: Normal rate, regular rhythm, normal heart sounds and intact distal pulses.   Pulmonary/Chest: Effort normal and breath sounds normal. No stridor. No respiratory distress. She has no wheezes.  Abdominal: Soft. Bowel sounds are normal. She exhibits no distension and no mass. There is no tenderness. There is no rebound and no guarding.  Musculoskeletal: Normal range of motion. She exhibits no edema and no tenderness.    Neurological: She is alert and oriented to person, place, and time.  Skin: Skin is warm and dry. No rash noted. She is not diaphoretic. No erythema. No pallor.  Psychiatric: She has a normal mood and affect. Her behavior is normal. Judgment and thought content normal.    Data Reviewed   Assessment    Morbid obesity with a BMI of 34.7 and comorbidities of hypertension and hyperlipidemia We had a discussion about the surgery and its risks and she remains interested in vertical sleeve gastrectomy.The risks of infection, bleeding, pain, scarring, weight regain, too little or too much weight loss,  vitamin deficiencies and need for lifelong vitamin supplementation, hair loss, need for protein supplementation, leaks, stricture, reflux, food intolerance, need for reoperation and conversion to roux Y gastric bypass, need for open surgery, injury to spleen or surrounding structures, DVT's, PE, and death again discussed with the patient and the patient expressed understanding and desires to proceed with laparoscopic vertical sleeve gastrectomy, possible open, intraoperative endoscopy. We discussed the preoperative care of and hospital course as well.     Plan    We will plan for vertical sleeve gastrectomy next week.        [name] DAVID 06/14/2013, 5:02 PM    

## 2013-06-19 NOTE — Interval H&P Note (Signed)
History and Physical Interval Note:  06/19/2013 7:06 AM  Page Spiro  has presented today for surgery, with the diagnosis of morbid obesity  The various methods of treatment have been discussed with the patient and family. After consideration of risks, benefits and other options for treatment, the patient has consented to  Procedure(s) with comments: LARAPROSCOPIC SLEEVE GASTRECTOMY WITH EGD ([name]) - LARAPROSCOPIC SLEEVE GASTRECTOMY WITH EGD  ESOPHAGOGASTRODUODENOSCOPY (EGD) ([name]) as a surgical intervention .  The patient's history has been reviewed, patient examined, no change in status, stable for surgery.  I have reviewed the patient's chart and labs.  Questions were answered to the patient's satisfaction.  She was seen in the preop area and risks of the procedure again discussed with the patient in lay terms.  The risks of infection, bleeding, pain, scarring, weight regain, too little or too much weight loss, vitamin deficiencies and need for lifelong vitamin supplementation, hair loss, need for protein supplementation, leaks, stricture, reflux, food intolerance, need for reoperation and conversion to roux Y gastric bypass, need for open surgery, injury to spleen or surrounding structures, DVT's, PE, and death again discussed with the patient and the patient expressed understanding and desires to proceed with laparoscopic vertical sleeve gastrectomy, possible open, intraoperative endoscopy.    Lodema Pilot DAVID

## 2013-06-20 ENCOUNTER — Encounter (HOSPITAL_COMMUNITY): Payer: Self-pay | Admitting: General Surgery

## 2013-06-20 LAB — COMPREHENSIVE METABOLIC PANEL
Albumin: 3.6 g/dL (ref 3.5–5.2)
BUN: 7 mg/dL (ref 6–23)
Creatinine, Ser: 0.78 mg/dL (ref 0.50–1.10)
Potassium: 4 mEq/L (ref 3.5–5.1)
Total Protein: 6.9 g/dL (ref 6.0–8.3)

## 2013-06-20 LAB — CBC WITH DIFFERENTIAL/PLATELET
Basophils Relative: 0 % (ref 0–1)
Eosinophils Absolute: 0.1 10*3/uL (ref 0.0–0.7)
Hemoglobin: 11.8 g/dL — ABNORMAL LOW (ref 12.0–15.0)
MCH: 26.5 pg (ref 26.0–34.0)
MCHC: 32.3 g/dL (ref 30.0–36.0)
Monocytes Absolute: 0.7 10*3/uL (ref 0.1–1.0)
Monocytes Relative: 7 % (ref 3–12)
Neutrophils Relative %: 57 % (ref 43–77)

## 2013-06-20 MED ORDER — SODIUM CHLORIDE 0.9 % IV SOLN
INTRAVENOUS | Status: DC
  Administered 2013-06-20 (×3): via INTRAVENOUS

## 2013-06-20 NOTE — Care Management Note (Signed)
    Page 1 of 1   06/20/2013     10:47:38 AM   CARE MANAGEMENT NOTE 06/20/2013  Patient:  [name], [name]   Account Number:  [id number]  Date Initiated:  06/20/2013  Documentation initiated by:  Lorenda Ishihara  Subjective/Objective Assessment:   37 yo female admitted s/p sleeve gastrectomy. PTA lived at home with spouse.     Action/Plan:   Home when stable   Anticipated DC Date:  06/23/2013   Anticipated DC Plan:  HOME/SELF CARE      DC Planning Services  CM consult      Choice offered to / List presented to:             Status of service:  Completed, signed off Medicare Important Message given?   (If response is "NO", the following Medicare IM given date fields will be blank) Date Medicare IM given:   Date Additional Medicare IM given:    Discharge Disposition:  HOME/SELF CARE  Per UR Regulation:  Reviewed for med. necessity/level of care/duration of stay  If discussed at Long Length of Stay Meetings, dates discussed:    Comments:

## 2013-06-20 NOTE — Progress Notes (Signed)
1 Day Post-Op  Subjective: Up and down with the pain control.  She has not been taking the morphine but only the toradol. No nausea   Objective: Vital signs in last 24 hours: Temp:  [97.4 F (36.3 C)-98.9 F (37.2 C)] 98.3 F (36.8 C) (07/09 0501) Pulse Rate:  [78-101] 78 (07/09 0501) Resp:  [14-24] 20 (07/09 0501) BP: (124-162)/(62-97) 134/97 mmHg (07/09 0501) SpO2:  [97 %-100 %] 100 % (07/09 0501)    Intake/Output from previous day: 07/08 0701 - 07/09 0700 In: 4341.7 [P.O.:200; I.V.:4141.7] Out: 1230 [Urine:1000; Drains:180; Blood:50] Intake/Output this shift:    General appearance: alert, cooperative and no distress Resp: nonlabored Cardio: normal rate, regular GI: mild incisional tenderness, no sign of infection, ND, JP serous, no peritoneal signs Extremities: SCD"s bilat  Lab Results:   Recent Labs  06/20/13 0403  WBC 11.3*  HGB 11.8*  HCT 36.5  PLT 310   BMET  Recent Labs  06/20/13 0403  NA 133*  K 4.0  CL 99  CO2 25  GLUCOSE 103*  BUN 7  CREATININE 0.78  CALCIUM 8.9   PT/INR No results found for this basename: LABPROT, INR,  in the last 72 hours ABG No results found for this basename: PHART, PCO2, PO2, HCO3,  in the last 72 hours  Studies/Results: No results found.  Anti-infectives: Anti-infectives   Start     Dose/Rate Route Frequency Ordered Stop   06/19/13 0600  ertapenem (INVANZ) 1 g in sodium chloride 0.9 % 50 mL IVPB     1 g 100 mL/hr over 30 Minutes Intravenous On call to O.R. 06/18/13 1828 06/19/13 0730      Assessment/Plan: s/p Procedure(s) with comments: LARAPROSCOPIC SLEEVE GASTRECTOMY WITH EGD ([name]) - LARAPROSCOPIC SLEEVE GASTRECTOMY WITH EGD  ESOPHAGOGASTRODUODENOSCOPY (EGD) ([name]) Advance diet continue to mobilize.  trial starting liquids  LOS: 1 day    Lodema Pilot DAVID 06/20/2013

## 2013-06-20 NOTE — Op Note (Signed)
[name], [name] NO.:  [id number]  MEDICAL RECORD NO.:  [id number]  LOCATION:  1527                         FACILITY:  [facility]  PHYSICIAN:  Lodema Pilot, MD       DATE OF BIRTH:  17-Oct-1976  DATE OF PROCEDURE:  06/19/2013 DATE OF DISCHARGE:                              OPERATIVE REPORT   PROCEDURE:  Laparoscopic vertical sleeve gastrectomy with intraoperative upper endoscopy.  PREOPERATIVE DIAGNOSIS:  Morbid obesity.  POSTOPERATIVE DIAGNOSIS:  Morbid obesity.  SURGEON:  Lodema Pilot, MD  ASSISTANT:  Dr. Daphine Deutscher.  ANESTHESIA:  General endotracheal tube anesthesia with 50 mL 1% lidocaine with epinephrine and 0.25% Marcaine in a 50:50 mixture.  FLUIDS:  1500 mL crystalloid.  ESTIMATED BLOOD LOSS:  50 mL.  DRAINS:  A 19-French Blake drain placed along the staple line.  COMPLICATIONS:  None apparent.  FINDINGS:  Normal-appearing anatomy, vertical sleeve gastrectomy performed with 36-French bougie.  Intraoperative endoscopy was negative for leaks or stricture or intraluminal bleeding.  INDICATION FOR PROCEDURE:  [name] is a 37 year old female with BMI of 35 and obesity related to comorbidities, hypertension, who has failed medical weight loss attempts and desires durable weight loss solutions.  OPERATIVE DETAILS:  [name] was seen and evaluated in the preoperative area and risks and benefits of procedure were again discussed in lay terms.  Informed consent was obtained.  She was given prophylactic heparin and prophylactic antibiotics and taken to the operating room, placed on table in supine position.  General endotracheal tube anesthesia was obtained and Foley catheter was placed.  Her abdomen was prepped and draped in a standard surgical fashion and procedure time-out was performed with all operative team members to confirm proper patient and procedure.  A 5-mm Optiview trocar was used to access the peritoneum in the left upper quadrant.   Pneumoperitoneum was obtained.  Laparoscope was introduced.  There was no evidence of bleeding or bowel injury upon entry.  A 5 mm left rectus port was placed under direct visualization. A 5 mm right upper quadrant port was placed and 15 mm trocar was placed in the right rectus region under direct visualization.  [name] liver retractor was used to retract the left lobe of the liver and she had normal-appearing anatomy.  She had some adhesions near the umbilicus, which were not taken down but the upper abdomen was fairly free of adhesions.  The pylorus was identified and it measured out 5 cm from the pylorus and started to divide the short gastric vessels using the Harmonic scalpel.  This division was carried up around the greater curvature of the stomach up towards the spleen and the stomach was separated from the spleen using Harmonic scalpel.  The cardia of the stomach was freed off the left crus of the diaphragm.  The left crus was identified.  She did not appear to have any posterior hiatal hernia.  I divided the posterior gastric adhesions with sharp dissection to the lesser curvature of the stomach and the stomach was freely mobile and completely mobilized.  All the tubes were removed from the mouth except for the endotracheal tube and I began creating my sleeve.  The first  2 firings of the stapler were taken with 60 mm black Tri-Staple loads. The first firing remeasured 5 cm from the pylorus and placed the staples in this area angling up towards the angle of His, taking care to avoid narrowing the stomach near the angularis incisura.  The first firing was taken and then the second black Tri-Staple load was placed at the crotch of the prior staple firing in the anticipated direction of firing ,and prior to firing the stapler, I passed the 36-French bougie through the mouth and along the lesser curvature of the stomach and into the antrum, and without reclamping the stapler, the  2nd load was taken.  Then I transitioned to a 60 mm purple Tri-Staple load with subsequent staple firings, fired up, continued the sleeve up towards the gastric fat pad and angle of His.  The bougie was checked with each clamping of the staple and the stomach was checked anteriorly and posteriorly and care was taken to avoid Christmas tree formation of the staple line.  With the final firings, I certified that I was stapling on stomach and just off the gastric fat pad as to minimize the risk of injuring the esophagus.  The stomach was completely transected and the staple line appeared hemostatic.  At this point, I insufflated the abdomen with saline and Dr. Daphine Deutscher performed upper endoscopy, passed the well lubricated fiberoptic endoscope through the mouth and in esophagus through the sleeve and to the pylorus insufflating the pylorus there. There was no evidence of any air bubbles or air leakage.  The sleeve was tubular without any evidence of stricturing or angulation and there was no evidence of internal bleeding and no air leakage.  The air was suctioned and the scope was removed and the 15-mm port site was enlarged and resected stomach was removed and sent to Pathology for permanent sectioning.  The staple line was inspected for hemostasis, which was noted to be adequate and Tisseel fibrin glue was placed along the sleeve staple lines.  A 19-French Blake drain was placed in the abdomen and just posterior to the sleeve staple lines and exited through the left upper quadrant trocar sites, sutured in place with a nylon drain stitch. An omentum was placed up over the drain and over the sleeves.  The retractors removed under direct visualization and the stomach extraction site was closed with interrupted 0 Vicryl sutures in open fashion. Sutures were secured and the abdomen was re-insufflated with carbon dioxide gas.  The abdominal wall closure was noted to be adequate without any evidence  of bowel injury.  The rest of the abdomen appeared hemostatic without any evidence of bleeding or bowel injury.  The remainder of the fluid was suctioned and the final trocar was removed under direct visualization.  The abdominal wall was noted to be hemostatic.  The extraction site was irrigated with sterile saline solution and the wounds were injected with a total of 50 mL of 1% lidocaine with epinephrine and 0.25% Marcaine in a 50:50 mixture.  Skin edges were approximated with 4-0 Monocryl subcuticular suture.  Skin was washed and dried and Dermabond was applied.  All sponge, needle, and instrument counts were correct at end of the case.  The patient tolerated the procedure well without apparent complications.          ______________________________ Lodema Pilot, MD     BL/MEDQ  D:  06/19/2013  T:  06/19/2013  Job:  [id number]

## 2013-06-21 MED ORDER — OXYCODONE-ACETAMINOPHEN 5-325 MG/5ML PO SOLN: 5.0000 mL | ORAL | Status: DC | PRN

## 2013-06-21 MED ORDER — ONDANSETRON 4 MG PO TBDP: 4.0000 mg | ORAL_TABLET | Freq: Three times a day (TID) | ORAL | Status: DC | PRN

## 2013-06-21 NOTE — Progress Notes (Signed)
Patient discharged ambulatory. Rx for roxicet and zofran given. States understanding of discharge instructions.

## 2013-06-21 NOTE — Patient Instructions (Signed)
Goals:  Eat 3 meals/day, Avoid meal skipping   Have lean, protein rich foods with all carbohydrates  Limit carbohydrate 2 servings/meal and 1 serving/snack  Choose more whole grains, lean protein, low-fat dairy, and fruits/non-starchy vegetables.   Aim for >30 min of physical activity daily  Limit sugar-sweetened beverages and concentrated sweets

## 2013-06-21 NOTE — Patient Instructions (Signed)
Follow:   Pre-Op Diet per MD 2 weeks prior to surgery  Phase 2- Liquids (clear/full) 2 weeks after surgery  Vitamin/Mineral/Calcium guidelines for purchasing bariatric supplements  Exercise guidelines pre and post-op per MD  Follow-up at [facility] in 2 weeks post-op for diet advancement. Contact Ashaya Raftery as needed with questions/concerns. 

## 2013-06-21 NOTE — Progress Notes (Signed)
Bariatric Class:  Appt start time: 0830 end time:  0930.  Pre-Operative Nutrition Class  Patient was seen on 06/07/13 for Pre-Operative Bariatric Surgery Education [facility].   Surgery date: 06/20/13 Surgery type: Sleeve Start weight at [facility]: 212.5 lbs (02/23/13) Weight today: 211.0 lbs BMI: 35.1 kg/m^2  Samples given per MNT protocol; Patient educated on appropriate usage: Bariatric Advantage Multivitamin Lot # [id number] Exp: 06/15  Celebrate Vitamins Calcium Citrate Lot # [id number] Exp: 03/16  Celebrate Vitamins Multivitamin Lot # [id number] Exp: 07/15  Celebrate Vitamins Sublingual B12 Lot # [id number] Exp: 11/15  Unjury Protein Powder Lot # [id number] Exp: 09/15  Premier Protein Shake Lot # [id number] Exp: 12/25/13  The following the learning objective met by the patient during this course:  Identify Pre-Op Dietary Goals and will begin 2 weeks pre-operatively  Identify appropriate sources of fluids and proteins   State protein recommendations and appropriate sources pre and post-operatively  Identify Post-Operative Dietary Goals and will follow for 2 weeks post-operatively  Identify appropriate multivitamin and calcium sources  Describe the need for physical activity post-operatively and will follow MD recommendations  State when to call healthcare provider regarding medication questions or post-operative complications  Handouts given during class include:  Pre-Op Bariatric Surgery Diet Handout  Protein Shake Handout  Post-Op Bariatric Surgery Nutrition Handout  [facility] Program Information Flyer  Support Group Information Flyer  [facility] Bariatric Supplements Price List  Follow-Up Plan: Patient will follow-up at [facility] 2 weeks post operatively for diet advancement per MD.

## 2013-06-21 NOTE — Progress Notes (Signed)
Supervised Weight Loss Visit: Pre-Operative Gastric Sleeve Surgery   Medical Nutrition Therapy: Appt start time: 0930   End time: 1000.   Primary concerns today: Pre-Operative Bariatric Surgery Nutrition Management   Start weight:  212.5 lbs (02/23/13)  Current weight: 215.1 lbs  Total weight loss: n/a BMI: 35.8 kg/m^2   Recent physical activity: Slight increase in exercise   Progress Towards Goal(s): In progress.   Nutritional Diagnosis:  [place]-3.3 Obesity related to past poor dietary habits and physical inactivity as evidenced by patient attending supervised weight loss program for insurance approval of bariatric surgery.   Intervention: Nutrition education including emotional eating and the importance of exercise both pre- and post-op bariatric surgery.   Handouts given during class include:   Emotional Eating: The Facts"  Monitoring/Evaluation: Dietary intake, exercise, and body weight. Follow up for Pre-Op class once notified of surgery date.  TANITA  BODY COMP RESULTS  02/23/13   BMI (kg/m^2) 35.4   Fat Mass (lbs) 96.5   Fat Free Mass (lbs) 116.0   Total Body Water (lbs) 85.0

## 2013-06-21 NOTE — Progress Notes (Signed)
2 Days Post-Op  Subjective: Feels well, no pain  Objective: Vital signs in last 24 hours: Temp:  [97.8 F (36.6 C)-98.7 F (37.1 C)] 98.1 F (36.7 C) (07/10 0550) Pulse Rate:  [72-80] 73 (07/10 0550) Resp:  [16-20] 18 (07/10 0550) BP: (124-145)/(78-89) 131/86 mmHg (07/10 0550) SpO2:  [99 %-100 %] 99 % (07/10 0550) Last BM Date: 06/18/13  Intake/Output from previous day: 07/09 0701 - 07/10 0700 In: 3622.1 [P.O.:620; I.V.:3002.1] Out: 3180 [Urine:3050; Drains:130] Intake/Output this shift:    General appearance: alert, cooperative and no distress Resp: clear to auscultation bilaterally Cardio: normal rate, regular GI: soft, NT, ND, wounds without infection, JP serous  Lab Results:   Recent Labs  06/20/13 0403  WBC 11.3*  HGB 11.8*  HCT 36.5  PLT 310   BMET  Recent Labs  06/20/13 0403  NA 133*  K 4.0  CL 99  CO2 25  GLUCOSE 103*  BUN 7  CREATININE 0.78  CALCIUM 8.9   PT/INR No results found for this basename: LABPROT, INR,  in the last 72 hours ABG No results found for this basename: PHART, PCO2, PO2, HCO3,  in the last 72 hours  Studies/Results: No results found.  Anti-infectives: Anti-infectives   Start     Dose/Rate Route Frequency Ordered Stop   06/19/13 0600  ertapenem (INVANZ) 1 g in sodium chloride 0.9 % 50 mL IVPB     1 g 100 mL/hr over 30 Minutes Intravenous On call to O.R. 06/18/13 1828 06/19/13 0730      Assessment/Plan: s/p Procedure(s) with comments: LARAPROSCOPIC SLEEVE GASTRECTOMY WITH EGD ([name]) - LARAPROSCOPIC SLEEVE GASTRECTOMY WITH EGD  ESOPHAGOGASTRODUODENOSCOPY (EGD) ([name]) Advance diet Discharge, she continues to do well.  She should be okay for discharge to home. Discharge instructions reviewed with her and her husband  LOS: 2 days    Lodema Pilot DAVID 06/21/2013

## 2013-06-21 NOTE — Progress Notes (Signed)
Patient alert and oriented, pain is controlled. Patient is tolerating fluids, tolerating protein shake today. Reviewed Gastric sleeve discharge instructions with patient and spouse. Patient is able to articulate understanding. Offered information on BELT program as well as Support Group offerings at [facility].   GASTRIC BYPASS / SLEEVE  Home Care Instructions  These instructions are to help you care for yourself when you go home.  Call: If you have any problems.   Call [phone] and ask for the surgeon on call   If you need immediate assistance come to the ER at [facility]. Tell the ER staff that you are a new post-op gastric bypass or gastric sleeve patient   Signs and symptoms to report:   Severe vomiting or nausea o If you cannot handle clear liquids for longer than 1 day, call your surgeon    Abdominal pain which does not get better after taking your pain medication   Fever greater than 100.4 F and chills   Heart rate over 100 beats a minute   Trouble breathing   Chest pain    Redness, swelling, drainage, or foul odor at incision (surgical) sites    If your incisions open or pull apart   Swelling or pain in calf (lower leg)   Diarrhea (Loose bowel movements that happen often), frequent watery, uncontrolled bowel movements   Constipation, (no bowel movements for 3 days) if this happens:  o Take Milk of Magnesia, 2 tablespoons by mouth, 3 times a day for 2 days if needed o Stop taking Milk of Magnesia once you have had a bowel movement o Call your doctor if constipation continues Or o Take Miralax  (instead of Milk of Magnesia) following the label instructions o Stop taking Miralax once you have had a bowel movement o Call your doctor if constipation continues   Anything you think is "abnormal for you"   Normal side effects after surgery:   Unable to sleep at night or unable to concentrate   Irritability   Being tearful (crying) or depressed These are common complaints, possibly  related to your anesthesia, stress of surgery and change in lifestyle, that usually go away a few weeks after surgery.  If these feelings continue, call your medical doctor.  Wound Care: You may have surgical glue, steri-strips, or staples over your incisions after surgery   Surgical glue:  Looks like a clear film over your incisions and will wear off a little at a time   Steri-strips : Adhesive strips of tape over your incisions. You may notice a yellowish color on the skin under the steri-strips. This is used to make the   steri-strips stick better. Do not pull the steri-strips off - let them fall off   Staples: Staples may be removed before you leave the hospital o If you go home with staples, call [facility] at for an appointment with your surgeon's nurse to have staples removed 10 days after surgery, [phone]   Showering: You may shower two (2) days after your surgery unless your surgeon tells you differently o Wash gently around incisions with warm soapy water, rinse well, and gently pat dry  o If you have a drain (tube from your incision), you may need someone to hold this while you shower  o No tub baths until staples are removed and incisions are healed     Medications:   Medications should be liquid or crushed if larger than the size of a dime   Extended   release pills (medication that releases a little bit at a time through the day) should not be crushed   Depending on the size and number of medications you take, you may need to space (take a few throughout the day)/change the time you take your medications so that you do not over-fill your pouch (smaller stomach)   Make sure you follow-up with your primary care physician to make medication changes needed during rapid weight loss and life-style changes   If you have diabetes, follow up with the doctor that orders your diabetes medication(s) within one week after surgery and check your blood sugar regularly.   Do not  drive while taking narcotics (pain medications)   Do not take acetaminophen (Tylenol) and Roxicet or Lortab Elixir at the same time since these pain medications contain acetaminophen  Diet:                    First 2 Weeks  You will see the nutritionist about two (2) weeks after your surgery. The nutritionist will increase the types of foods you can eat if you are handling liquids well:   If you have severe vomiting or nausea and cannot handle clear liquids lasting longer than 1 day, call your surgeon  Protein Shake   Drink at least 2 ounces of shake 5-6 times per day   Each serving of protein shakes (usually 8 - 12 ounces) should have a minimum of:  o 15 grams of protein  o And no more than 5 grams of carbohydrate    Goal for protein each day: o Men = 80 grams per day o Women = 60 grams per day   Protein powder may be added to fluids such as non-fat milk or Lactaid milk or Soy milk (limit to 35 grams added protein powder per serving)  Hydration   Slowly increase the amount of water and other clear liquids as tolerated (See Acceptable Fluids)   Slowly increase the amount of protein shake as tolerated     Sip fluids slowly and throughout the day   May use sugar substitutes in small amounts (no more than 6 - 8 packets per day; i.e. Splenda)  Fluid Goal   The first goal is to drink at least 8 ounces of protein shake/drink per day (or as directed by the nutritionist); some examples of protein shakes are Syntrax Nectar, Adkins Advantage, EAS Edge HP, and Unjury. See handout from pre-op Bariatric Education Class: o Slowly increase the amount of protein shake you drink as tolerated o You may find it easier to slowly sip shakes throughout the day o It is important to get your proteins in first   Your fluid goal is to drink 64 - 100 ounces of fluid daily o It may take a few weeks to build up to this   32 oz (or more) should be clear liquids  And    32 oz (or more) should be full liquids (see  below for examples)   Liquids should not contain sugar, caffeine, or carbonation  Clear Liquids:   Water or Sugar-free flavored water (i.e. Fruit H2O, Propel)   Decaffeinated coffee or tea (sugar-free)   Crystal Lite, Wyler's Lite, Minute Maid Lite   Sugar-free Jell-O   Bouillon or broth   Sugar-free Popsicle:   *Less than 20 calories each; Limit 1 per day  Full Liquids: Protein Shakes/Drinks + 2 choices per day of other full liquids   Full liquids must be: o No   More Than 12 grams of Carbs per serving  o No More Than 3 grams of Fat per serving   Strained low-fat cream soup   Non-Fat milk   Fat-free Lactaid Milk   Sugar-free yogurt (Dannon Lite & Fit, Greek yogurt)      Vitamins and Minerals   Start 1 day after surgery unless otherwise directed by your surgeon   2 Chewable Multivitamin / Multimineral Supplement with iron (i.e. Centrum for Adults)   Vitamin B-12, 350 - 500 micrograms sub-lingual (place tablet under the tongue) each day   Chewable Calcium Citrate with Vitamin D-3 (Example: 3 Chewable Calcium Plus 600 with Vitamin D-3) o Take 500 mg three (3) times a day for a total of 1500 mg each day o Do not take all 3 doses of calcium at one time as it may cause constipation, and you can only absorb 500 mg  at a time  o Do not mix multivitamins containing iron with calcium supplements; take 2 hours apart o Do not substitute Tums (calcium carbonate) for your calcium   Menstruating women and those at risk for anemia (a blood disease that causes weakness) may need extra iron o Talk with your doctor to see if you need more iron   If you need extra iron: Total daily Iron recommendation (including Vitamins) is 50 to 100 mg Iron/day   Do not stop taking or change any vitamins or minerals until you talk to your nutritionist or surgeon   Your nutritionist and/or surgeon must approve all vitamin and mineral supplements   Activity and Exercise: It is important to continue walking at home.   Limit your physical activity as instructed by your doctor.  During this time, use these guidelines:   Do not lift anything greater than ten (10) pounds for at least two (2) weeks   Do not go back to work or drive until your surgeon says you can   You may have sex when you feel comfortable  o It is VERY important for female patients to use a reliable birth control method; fertility often increases after surgery  o Do not get pregnant for at least 18 months   Start exercising as soon as your doctor tells you that you can o Make sure your doctor approves any physical activity   Start with a simple walking program   Walk 5-15 minutes each day, 7 days per week.    Slowly increase until you are walking 30-45 minutes per day Consider joining our BELT program. [phone] or email [email or web address]   Special Instructions Things to remember:   Free counseling is available for you and your family through collaboration between [facility] and [employer]. Please call [phone] and leave a message   Use your CPAP when sleeping if this applies to you   [facility] has a free Bariatric Surgery Support Group that meets monthly, the 3rd Thursday, 6 pm, [facility] Classrooms You can see classes online at [email or web address]   It is very important to keep all follow up appointments with your surgeon, nutritionist, primary care physician, and behavioral health practitioner o After the first year, please follow up with your bariatric surgeon and nutritionist at least once a year in order to maintain best weight loss results [facility]: [phone] [facility]: [phone] Bariatric Nurse Coordinator: [phone]     

## 2013-06-26 ENCOUNTER — Encounter: Payer: Self-pay | Admitting: *Deleted

## 2013-06-26 ENCOUNTER — Encounter: Payer: BC Managed Care – PPO | Attending: General Surgery | Admitting: *Deleted

## 2013-06-26 DIAGNOSIS — Z713 Dietary counseling and surveillance: Secondary | ICD-10-CM | POA: Insufficient documentation

## 2013-06-27 ENCOUNTER — Encounter: Payer: Self-pay | Admitting: Family

## 2013-06-27 ENCOUNTER — Ambulatory Visit (INDEPENDENT_AMBULATORY_CARE_PROVIDER_SITE_OTHER): Payer: BC Managed Care – PPO | Admitting: Family

## 2013-06-27 VITALS — BP 100/70 | HR 83 | Temp 98.6°F | Resp 16 | Ht 65.5 in | Wt 196.0 lb

## 2013-06-27 DIAGNOSIS — I10 Essential (primary) hypertension: Secondary | ICD-10-CM

## 2013-06-27 NOTE — Progress Notes (Signed)
Subjective:    Patient ID: [name], female    DOB: 1976-04-18, 37 y.o.   MRN: [id number]  HPI  [name] is a 37 yr old female who presents today to discuss blood pressure.  She underwent laparoscopic gastric sleeve on 7/8 and last Friday began having headaches and pedal edema.  When she checked her BP at home BP was ranging 130's/98.  She restarted diovan hct on Friday. On Saturday she developed hypotension with BP 98/73.  She has been holding BP med since that time.      Review of Systems See HPI  Past Medical History  Diagnosis Date  . Anemia   . Blood in stool   . Chicken pox   . Diverticulitis   . Hypertension   . Colon polyps   . Obesity     History   Social History  . Marital Status: Married    Spouse Name: [name]    Number of Children: [name]  . Years of Education: [name]   Occupational History  . Not on file.   Social History Main Topics  . Smoking status: Former Smoker -- 0.25 packs/day for 2 years    Types: Cigarettes    Quit date: 06/11/1996  . Smokeless tobacco: Never Used  . Alcohol Use: Yes     Comment: 2-3 mixed drinks week- none x 1 month  . Drug Use: No  . Sexually Active: Not on file   Other Topics Concern  . Not on file   Social History Narrative  . No narrative on file    Past Surgical History  Procedure Laterality Date  . Cesarean section  2006  . Laparoscopic cholecystectomy  2012  . Laparoscopic gastric sleeve resection [name] 06/19/2013    Procedure: LARAPROSCOPIC SLEEVE GASTRECTOMY WITH EGD;  Surgeon: Lodema Pilot, DO;  Location: [facility];  Service: General;  Laterality: [name];  LARAPROSCOPIC SLEEVE GASTRECTOMY WITH EGD   . Esophagogastroduodenoscopy [name] 06/19/2013    Procedure: ESOPHAGOGASTRODUODENOSCOPY (EGD);  Surgeon: Lodema Pilot, DO;  Location: [facility];  Service: General;  Laterality: [name];    Family History  Problem Relation Age of Onset  . Stroke      family hx  . Crohn's disease Father   . Lupus Father   . Ulcerative colitis  Father   . Cancer Other     breast    No Known Allergies  Current Outpatient Prescriptions on File Prior to Visit  Medication Sig Dispense Refill  . cetirizine (ZYRTEC) 10 MG tablet Take 10 mg by mouth daily as needed for allergies.       Marland Kitchen levonorgestrel (MIRENA) 20 MCG/24HR IUD 1 each by Intrauterine route once.       No current facility-administered medications on file prior to visit.    BP 100/70  Pulse 83  Temp(Src) 98.6 F (37 C) (Oral)  Resp 16  Ht 5' 5.5" (1.664 m)  Wt 196 lb 0.6 oz (88.923 kg)  BMI 32.11 kg/m2  SpO2 99%       Objective:   Physical Exam  Constitutional: She is oriented to person, place, and time. She appears well-developed and well-nourished. No distress.  Cardiovascular: Normal rate and regular rhythm.   No murmur heard. Pulmonary/Chest: Effort normal and breath sounds normal. No respiratory distress. She has no wheezes. She has no rales. She exhibits no tenderness.  Musculoskeletal:  Trace bilateral LE edema  Neurological: She is alert and oriented to person, place, and time.  Skin: Skin is warm and  dry.  Psychiatric: She has a normal mood and affect. Her behavior is normal. Judgment and thought content normal.          Assessment & Plan:

## 2013-06-27 NOTE — Assessment & Plan Note (Addendum)
BP not requiring med at this time. Plan as follows:  Check blood pressure once daily. If BP<140/90 hold diovan HCT. If BP >140/90 take 1/2 tab of diovant HCT. Follow up in 6 weeks.

## 2013-06-27 NOTE — Patient Instructions (Addendum)
Check blood pressure once daily. If BP<140/90 hold diovan HCT. If BP >140/90 take 1/2 tab of diovant HCT. Follow up in 6 weeks. Bring blood pressure log with you to this appointment. Call if questions/concerns.

## 2013-07-02 NOTE — Discharge Summary (Signed)
Physician Discharge Summary  Patient ID: [name] MRN: [id number] DOB/AGE: [birth year] 37 y.o.  Admit date: 06/19/2013 Discharge date: 07/02/2013  Admission Diagnoses: obesity  Discharge Diagnoses: same Active Problems:   * No active hospital problems. *   Discharged Condition: stable  Hospital Course: to OR 06/19/13 for lap sleeve gastrectomy.  No apparent complications.  Her diet was advanced on POD 1 and she tolerated this okay.  She was HD stable and tolerating bariatric diet on POD 2 and ready for discharge to home on POD 2.  Consults: None  Significant Diagnostic Studies: none  Treatments: surgery: 06/19/13 lap sleeve gastrectomy  Disposition: 01-Home or Self Care  Discharge Orders   Future Appointments Provider Department Dept Phone   07/19/2013 2:30 PM Lodema Pilot, DO [facility] [place] [phone]   08/08/2013 4:30 PM Orvil Feil Himmelrich, RD [facility] [phone]   Future Orders Complete By Expires     Call MD for:  difficulty breathing, headache or visual disturbances  As directed     Call MD for:  hives  As directed     Call MD for:  persistant dizziness or light-headedness  As directed     Call MD for:  persistant nausea and vomiting  As directed     Call MD for:  redness, tenderness, or signs of infection (pain, swelling, redness, odor or green/yellow discharge around incision site)  As directed     Call MD for:  severe uncontrolled pain  As directed     Call MD for:  temperature >100.4  As directed     Discharge instructions  As directed     Comments:      Call [phone] to schedule follow up appointment in 3 weeks. May shower tomorrow. Full liquid diet x1 week, then pureed diet x1 week, then soft diet x1 week, then gradually advance to high protein, low fat, low carb diet as tolerated. May increase activity as tolerated. Crush all medications or use liquid medications x 4 weeks Follow up with your  primary care doctor about your blood pressure medications and to monitor your blood pressure.    Increase activity slowly  As directed         Medication List    STOP taking these medications       acetaminophen 500 MG tablet  Commonly known as:  TYLENOL     valsartan-hydrochlorothiazide 80-12.5 MG per tablet  Commonly known as:  DIOVAN-HCT      TAKE these medications       cetirizine 10 MG tablet  Commonly known as:  ZYRTEC  Take 10 mg by mouth daily as needed for allergies.     levonorgestrel 20 MCG/24HR IUD  Commonly known as:  MIRENA  1 each by Intrauterine route once.         SignedLodema Pilot DAVID 07/02/2013, 12:06 PM

## 2013-07-13 ENCOUNTER — Ambulatory Visit (INDEPENDENT_AMBULATORY_CARE_PROVIDER_SITE_OTHER): Payer: BC Managed Care – PPO | Admitting: General Surgery

## 2013-07-14 NOTE — Progress Notes (Addendum)
Bariatric Class:  Appt start time: 1600 end time:  1700.  2 Week Post-Operative Nutrition Class  Patient was seen on 06/26/13 for Post[facility]ative [facility]   Surgery date: 06/20/13 Surgery type: Sleeve Start weight at [facility]: 212.5 lbs (02/23/13) Pre-Op class weight:  215.1 lbs  Weight today: 197.0 lbs Weight change: 14.0 lb LOSS Total weight loss: 14.0 lbs  TANITA  BODY COMP RESULTS  02/23/13 06/07/13 06/26/13   BMI (kg/m^2) 35.4 35.1 32.8   Fat Mass (lbs) 96.5 --- 87.5   Fat Free Mass (lbs) 116.0 --- 109.5   Total Body Water (lbs) 85.0 --- 80.0   The following the learning objectives were met by the patient during this course:  Identifies Phase 3A (Soft, High Proteins) Dietary Goals and will begin from 2 weeks post-operatively to 2 months post-operatively  Identifies appropriate sources of fluids and proteins   States protein recommendations and appropriate sources post-operatively  Identifies the need for appropriate texture modifications, mastication, and bite sizes when consuming solids  Identifies appropriate multivitamin and calcium sources post-operatively  Describes the need for physical activity post-operatively and will follow MD recommendations  States when to call healthcare provider regarding medication questions or post-operative complications  Handouts given during class include:  Phase 3A: Soft, High Protein Diet Handout  Follow-Up Plan: Patient will follow-up at [facility] in 6 weeks for 2 months post-op nutrition visit for diet advancement per MD.

## 2013-07-14 NOTE — Patient Instructions (Addendum)
Patient to follow Phase 3A-Soft, High Protein Diet and follow-up at [facility] in 6 weeks for 2 months post-op nutrition visit for diet advancement. 

## 2013-07-19 ENCOUNTER — Ambulatory Visit (INDEPENDENT_AMBULATORY_CARE_PROVIDER_SITE_OTHER): Payer: BC Managed Care – PPO | Admitting: General Surgery

## 2013-07-19 ENCOUNTER — Encounter (INDEPENDENT_AMBULATORY_CARE_PROVIDER_SITE_OTHER): Payer: Self-pay | Admitting: General Surgery

## 2013-07-19 VITALS — BP 120/78 | HR 84 | Resp 14 | Ht 65.0 in | Wt 186.6 lb

## 2013-07-19 DIAGNOSIS — Z5189 Encounter for other specified aftercare: Secondary | ICD-10-CM

## 2013-07-19 DIAGNOSIS — Z4889 Encounter for other specified surgical aftercare: Secondary | ICD-10-CM

## 2013-07-19 NOTE — Progress Notes (Signed)
Subjective:     Patient ID: [name], female   DOB: 16-Apr-1976, 37 y.o.   MRN: [id number]  HPI 4 weeks s/p vertical sleeve gastrectomy  For obesity.she is doing very well and has lost about 20 pounds since her procedure. She feels well and has come off of her blood pressure medication.  She has no complaints. She is taking her vitamins and protein and denies any numbness or tingling. She has not had any nausea or vomiting or reflux. She has not really been exercising.   Review of Systems     Objective:   Physical Exam No distress and nontoxic-appearing Her abdomen is soft and nontender exam her incisions are healing nicely without sign of infection there is no evidence of any neurologic deficit    Assessment:     Status post vertical sleeve gastrectomy-doing well She's doing very well from the procedure without any evidence of any postoperative complication. She has no complaints. We again discussed the postoperative care and I recommended that she increase her physical activity and continue with her bariatric diet. She is doing well with her vitamins and protein.     Plan:     Continue with multivitamins and protein and increase physical activity and I will see her back in about 2 months for repeat exam and nutrition labs

## 2013-07-21 ENCOUNTER — Other Ambulatory Visit: Payer: Self-pay | Admitting: *Deleted

## 2013-07-23 NOTE — Progress Notes (Signed)
ERROR

## 2013-08-08 ENCOUNTER — Ambulatory Visit: Payer: BC Managed Care – PPO | Admitting: *Deleted

## 2013-08-17 ENCOUNTER — Ambulatory Visit (INDEPENDENT_AMBULATORY_CARE_PROVIDER_SITE_OTHER): Payer: BC Managed Care – PPO | Admitting: Family

## 2013-08-17 ENCOUNTER — Encounter: Payer: Self-pay | Admitting: Family

## 2013-08-17 VITALS — BP 124/82 | HR 66 | Temp 98.6°F | Resp 16 | Ht 65.5 in | Wt 171.1 lb

## 2013-08-17 DIAGNOSIS — D170 Benign lipomatous neoplasm of skin and subcutaneous tissue of head, face and neck: Secondary | ICD-10-CM

## 2013-08-17 DIAGNOSIS — I10 Essential (primary) hypertension: Secondary | ICD-10-CM

## 2013-08-17 DIAGNOSIS — D179 Benign lipomatous neoplasm, unspecified: Secondary | ICD-10-CM

## 2013-08-17 DIAGNOSIS — D1739 Benign lipomatous neoplasm of skin and subcutaneous tissue of other sites: Secondary | ICD-10-CM

## 2013-08-17 NOTE — Patient Instructions (Addendum)
You will be contact about your referral to the surgeon. Please let us know if you have not heard back within 1 week about your referral. Please schedule a follow up appointment in 6 months.

## 2013-08-17 NOTE — Assessment & Plan Note (Signed)
She would like to have these removed.  Will refer to gen surgery for consulation.

## 2013-08-17 NOTE — Assessment & Plan Note (Signed)
BP Readings from Last 3 Encounters:  08/17/13 124/82  07/19/13 120/78  06/27/13 100/70    BP looks great off of medications.  I have asked pt to continue to monitor her blood pressures at home. Call if BP rises.

## 2013-08-17 NOTE — Progress Notes (Signed)
Subjective:    Patient ID: [name], female    DOB: Aug 04, 1976, 37 y.o.   MRN: [id number]  HPI   Ms. Pittinger is a 37 yr old female who presents today for follow up of her blood pressure. Since her bariatric procedure, she reports that she has lost a total of 45 pounds.  She is off of blood pressure medication and reports that on average her blood pressure is 114/79.  She reports feeling better since losing weight.  She is concerned about the growths on her scalp, noting that they have enlarged recently. She is interested in having them removed.    Review of Systems See HPI  Past Medical History  Diagnosis Date  . Anemia   . Blood in stool   . Chicken pox   . Diverticulitis   . Hypertension   . Colon polyps   . Obesity     History   Social History  . Marital Status: Married    Spouse Name: [name]    Number of Children: [name]  . Years of Education: [name]   Occupational History  . Not on file.   Social History Main Topics  . Smoking status: Former Smoker -- 0.25 packs/day for 2 years    Types: Cigarettes    Quit date: 06/11/1996  . Smokeless tobacco: Never Used  . Alcohol Use: Yes     Comment: 2-3 mixed drinks week- none x 1 month  . Drug Use: No  . Sexual Activity: Not on file   Other Topics Concern  . Not on file   Social History Narrative  . No narrative on file    Past Surgical History  Procedure Laterality Date  . Cesarean section  2006  . Laparoscopic cholecystectomy  2012  . Laparoscopic gastric sleeve resection [name] 06/19/2013    Procedure: LARAPROSCOPIC SLEEVE GASTRECTOMY WITH EGD;  Surgeon: Lodema Pilot, DO;  Location: [facility];  Service: General;  Laterality: [name];  LARAPROSCOPIC SLEEVE GASTRECTOMY WITH EGD   . Esophagogastroduodenoscopy [name] 06/19/2013    Procedure: ESOPHAGOGASTRODUODENOSCOPY (EGD);  Surgeon: Lodema Pilot, DO;  Location: [facility];  Service: General;  Laterality: [name];    Family History  Problem Relation Age of Onset  . Stroke      family  hx  . Crohn's disease Father   . Lupus Father   . Ulcerative colitis Father   . Cancer Other     breast    No Known Allergies  Current Outpatient Prescriptions on File Prior to Visit  Medication Sig Dispense Refill  . Calcium 500 MG CHEW Chew 1 each by mouth 3 (three) times daily.      . Cyanocobalamin (VITAMIN B-12) 1000 MCG SUBL Place 1 each under the tongue every morning.      Marland Kitchen levonorgestrel (MIRENA) 20 MCG/24HR IUD 1 each by Intrauterine route once.      . Multiple Vitamins-Minerals (MULTIVITAL) CHEW Chew 1 each by mouth 2 (two) times daily.       No current facility-administered medications on file prior to visit.    BP 124/82  Pulse 66  Temp(Src) 98.6 F (37 C) (Oral)  Resp 16  Ht 5' 5.5" (1.664 m)  Wt 171 lb 1.3 oz (77.601 kg)  BMI 28.03 kg/m2  SpO2 99%        Objective:   Physical Exam  Constitutional: She is oriented to person, place, and time. She appears well-developed and well-nourished. No distress.  HENT:  Head:    Two firm rubbery  mobile masses noted beneath hairline on scalp  Cardiovascular: Normal rate and regular rhythm.   Pulmonary/Chest: Effort normal and breath sounds normal.  Neurological: She is alert and oriented to person, place, and time.          Assessment & Plan:

## 2013-09-12 DIAGNOSIS — S92919A Unspecified fracture of unspecified toe(s), initial encounter for closed fracture: Secondary | ICD-10-CM

## 2013-09-12 HISTORY — DX: Unspecified fracture of unspecified toe(s), initial encounter for closed fracture: S92.919A

## 2013-09-19 ENCOUNTER — Encounter (INDEPENDENT_AMBULATORY_CARE_PROVIDER_SITE_OTHER): Payer: Self-pay

## 2013-09-19 ENCOUNTER — Ambulatory Visit (INDEPENDENT_AMBULATORY_CARE_PROVIDER_SITE_OTHER): Payer: BC Managed Care – PPO | Admitting: General Surgery

## 2013-09-19 ENCOUNTER — Encounter (INDEPENDENT_AMBULATORY_CARE_PROVIDER_SITE_OTHER): Payer: Self-pay | Admitting: General Surgery

## 2013-09-19 VITALS — BP 112/70 | HR 68 | Temp 97.1°F | Resp 16 | Ht 65.0 in | Wt 162.6 lb

## 2013-09-19 DIAGNOSIS — K912 Postsurgical malabsorption, not elsewhere classified: Secondary | ICD-10-CM

## 2013-09-19 DIAGNOSIS — Z09 Encounter for follow-up examination after completed treatment for conditions other than malignant neoplasm: Secondary | ICD-10-CM

## 2013-09-19 DIAGNOSIS — R220 Localized swelling, mass and lump, head: Secondary | ICD-10-CM

## 2013-09-19 DIAGNOSIS — R229 Localized swelling, mass and lump, unspecified: Secondary | ICD-10-CM

## 2013-09-19 NOTE — Progress Notes (Signed)
Subjective:     Patient ID: [name], female   DOB: 02/09/1976, 36 y.o.   MRN: [id number]  HPI This patient is a 3 month status post vertical sleeve gastrectomy for obesity. She has no complaints and is doing remarkably well. She is down approaching her goal of 140 pounds. She is taking any protein at about 60 g per day and has a little bit of constipation but bowels are functioning. She has no food intolerance or nausea or vomiting or reflux. She has been under control and is taking her vitamins. She is off all her blood pressure medications and really has no complaints. She has not been doing much exercise. She also complains of several cysts on her scalp 2 in the forehead region which had been increasing in size and 2 on the left side of her scalp which are smaller. She says that these are itchy and have been increasing in size and she would like to have it removed  Review of Systems     Objective:   Physical Exam She is in no acute distress and nontoxic-appearing She has two 1/2-2 cm Scalp cyst just at her hairline of her forehead of as well as 2 smaller nodules on the left parietal region of her scalp     Assessment:     Status post vertical sleeve gastrectomy has been doing well She's doing very well from her last procedure and hasn't had good weight loss. She has no evidence of any postoperative complications and is approaching her goal weight. I recommend that she continue with his bariatric diet and increase her physical activities in order to obtain the additional 20 pound weight loss that she requires to reach her goals. 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 I discussed with her the procedure and the risks including infection bleeding, pain, scarring, recurrence of, poor cosmesis, and the need to shave the head of the possibility of lack of hair growth in this area She expressed Understanding and  would like to proceed with excision of scalp cyst    Plan:     We will set her up for excision of scalp cysts We will also check some nutrition labs and will see her back in about 3 months at her 6 month visit      

## 2013-09-20 ENCOUNTER — Encounter: Payer: Self-pay | Admitting: Physician Assistant

## 2013-09-20 ENCOUNTER — Ambulatory Visit: Payer: BC Managed Care – PPO | Admitting: Physician Assistant

## 2013-09-20 ENCOUNTER — Ambulatory Visit (INDEPENDENT_AMBULATORY_CARE_PROVIDER_SITE_OTHER): Payer: BC Managed Care – PPO | Admitting: Physician Assistant

## 2013-09-20 ENCOUNTER — Ambulatory Visit (HOSPITAL_BASED_OUTPATIENT_CLINIC_OR_DEPARTMENT_OTHER)
Admission: RE | Admit: 2013-09-20 | Discharge: 2013-09-20 | Disposition: A | Discharge status: Home / Self Care | Payer: BC Managed Care – PPO | Source: Ambulatory Visit | Attending: Physician Assistant | Admitting: Physician Assistant

## 2013-09-20 VITALS — BP 116/78 | HR 72 | Temp 97.8°F | Resp 16 | Ht 65.0 in | Wt 162.5 lb

## 2013-09-20 DIAGNOSIS — X58XXXA Exposure to other specified factors, initial encounter: Secondary | ICD-10-CM | POA: Insufficient documentation

## 2013-09-20 DIAGNOSIS — S99922A Unspecified injury of left foot, initial encounter: Secondary | ICD-10-CM

## 2013-09-20 DIAGNOSIS — S97109A Crushing injury of unspecified toe(s), initial encounter: Secondary | ICD-10-CM | POA: Insufficient documentation

## 2013-09-20 DIAGNOSIS — S8290XD Unspecified fracture of unspecified lower leg, subsequent encounter for closed fracture with routine healing: Secondary | ICD-10-CM

## 2013-09-20 DIAGNOSIS — S92919A Unspecified fracture of unspecified toe(s), initial encounter for closed fracture: Secondary | ICD-10-CM | POA: Insufficient documentation

## 2013-09-20 DIAGNOSIS — S8990XA Unspecified injury of unspecified lower leg, initial encounter: Secondary | ICD-10-CM

## 2013-09-20 DIAGNOSIS — S92425D Nondisplaced fracture of distal phalanx of left great toe, subsequent encounter for fracture with routine healing: Secondary | ICD-10-CM | POA: Insufficient documentation

## 2013-09-20 NOTE — Patient Instructions (Signed)
Please wear protective/immobilizing shoe over the next 2 weeks.  Elevate foot while resting.  Ice for 15 min every 4-6 hours as tolerated for the next few days to help with swelling.  Ibuprofen or Tylenol for pain.  Follow-up in 1 1/2 weeks

## 2013-09-20 NOTE — Assessment & Plan Note (Signed)
"  Buddy Taping"; Immobilization shoe given.  RICE therapy.  Ibuprofen or Tylenol for pain.  Follow-up in 1.5 weeks.

## 2013-09-20 NOTE — Progress Notes (Signed)
Patient ID: [name], female   DOB: 1976-03-04, 37 y.o.   MRN: [id number]  Patient is a 37 year old female with no significant past medical history who presents to clinic today complaining of pain in her left great toe, after she rolled a shopping cart onto her left foot yesterday evening.  Patient endorses pain, bruising, and swelling of left great toe.  Patient states that she is unable to weight-bear on to her great toe.  Has noticed decreased range of motion secondary to pain.  Denies any open fracture.  Denies pallor or Mr. great toe. Does endorse some change in sensation to touch her great toe.  Denies injury or damage to other phalanges.  Endorses swelling the forefoot and ankle of left lower extremity. Denies any decrease in range of motion or pain of the ankle.  Denies twisting injury and ankle.  Denies injury, else where.  Patient denies fever, chills.  No erythema of left lower extremity.  Patient has not taken any medication for pain. Has not attempted any other conservative measures.   Past Medical History  Diagnosis Date  . Anemia   . Blood in stool   . Chicken pox   . Diverticulitis   . Hypertension   . Colon polyps   . Obesity     Current Outpatient Prescriptions on File Prior to Visit  Medication Sig Dispense Refill  . Calcium 500 MG CHEW Chew 1 each by mouth 3 (three) times daily.      . Cyanocobalamin (VITAMIN B-12) 1000 MCG SUBL Place 1 each under the tongue every morning.      Marland Kitchen levonorgestrel (MIRENA) 20 MCG/24HR IUD 1 each by Intrauterine route once.      . Multiple Vitamins-Minerals (MULTIVITAL) CHEW Chew 1 each by mouth 2 (two) times daily.       No current facility-administered medications on file prior to visit.    No Known Allergies  Family History  Problem Relation Age of Onset  . Stroke      family hx  . Crohn's disease Father   . Lupus Father   . Ulcerative colitis Father   . Cancer Other     breast    History   Social History  . Marital  Status: Married    Spouse Name: [name]    Number of Children: [name]  . Years of Education: [name]   Social History Main Topics  . Smoking status: Former Smoker -- 0.25 packs/day for 2 years    Types: Cigarettes    Quit date: 06/11/1996  . Smokeless tobacco: Never Used  . Alcohol Use: Yes     Comment: 2-3 mixed drinks week- none x 1 month  . Drug Use: No  . Sexual Activity: None   Other Topics Concern  . None   Social History Narrative  . None   ROS See history of present illness. All other review of systems are negative.  Filed Vitals:   09/20/13 1511  BP: 116/78  Pulse: 72  Temp: 97.8 F (36.6 C)  Resp: 16    Physical Exam  Vitals reviewed. Constitutional: She is oriented to person, place, and time and well-developed, well-nourished, and in no distress.  HENT:  Head: Normocephalic and atraumatic.  Eyes: Conjunctivae are normal.  Neck: Neck supple.  Cardiovascular: Normal rate, regular rhythm and intact distal pulses.   Pulses:      Dorsalis pedis pulses are 2+ on the right side, and 2+ on the left side.  Posterior tibial pulses are 2+ on the right side, and 2+ on the left side.  Good capillary refill in nail beds of bilateral lower extremities.  Musculoskeletal:       Right ankle: Normal.       Left ankle: She exhibits swelling. She exhibits normal range of motion, no ecchymosis, no deformity, no laceration and normal pulse. No tenderness. Achilles tendon normal.       Right foot: Normal.       Left foot: She exhibits decreased range of motion, tenderness, bony tenderness and swelling. She exhibits normal capillary refill, no crepitus, no deformity and no laceration.  Neurological: She is alert and oriented to person, place, and time.  Decreased sensation of left big toe noted on examination.  Not associated with vascular compromise.  Skin: Skin is warm and dry.  Large ecchymosis noted on first phalanx of left foot, with associated swelling.     LEFT FOOT -  COMPLETE 3+ VIEW  COMPARISON: None.  FINDINGS:  Fracture through the base of the 1st distal phalanx, laterally. This  extends into the lateral aspect of the 1st IP joint. No other  fractures are seen. No significant displacement or angulation.  IMPRESSION:  Fracture of the base of the 1st distal phalanx, as described above.  Electronically Signed  By: Gordan Payment M.D.  On: 09/20/2013 16:21   Assessment/Plan: Nondisp fx of distal phalanx of left great toe with routine healing "Buddy Taping"; Immobilization shoe given.  RICE therapy.  Ibuprofen or Tylenol for pain.  Follow-up in 1.5 weeks.

## 2013-10-02 ENCOUNTER — Encounter (HOSPITAL_COMMUNITY): Payer: Self-pay | Admitting: Pharmacy Technician

## 2013-10-03 NOTE — Progress Notes (Signed)
Chest x-ray 06/11/13 on [facility], EKG 02/22/13 on [facility]

## 2013-10-03 NOTE — Patient Instructions (Addendum)
20 L[address] STEN[address]  10/03/2013   Your procedure is scheduled on: 10/10/13  Report to [facility] at 6:00 AM.  Call this number if you have problems the morning of surgery [phone]: [phone]   Remember:   Do not eat food or drink liquids After Midnight.     Take these medicines the morning of surgery with A SIP OF WATER: NONE   Do not wear jewelry, make-up or nail polish.  Do not wear lotions, powders, or perfumes. You may wear deodorant.  Do not shave 48 hours prior to surgery. Men may shave face and neck.  Do not bring valuables to the hospital.  Contacts, dentures or bridgework may not be worn into surgery.  Leave suitcase in the car. After surgery it may be brought to your room.  For patients admitted to the hospital, checkout time is 11:00 AM the day of discharge.   Patients discharged the day of surgery will not be allowed to drive home.  Name and phone number of your driver:  Special Instructions: n/a   Please read over the following fact sheets that you were given:  Merleen Nicely, RN  pre op nurse call if needed [phone]    FAILURE TO FOLLOW THESE INSTRUCTIONS MAY RESULT IN CANCELLATION OF YOUR SURGERY   Patient Signature: ___________________________________________

## 2013-10-04 ENCOUNTER — Ambulatory Visit (INDEPENDENT_AMBULATORY_CARE_PROVIDER_SITE_OTHER): Payer: BC Managed Care – PPO | Admitting: Physician Assistant

## 2013-10-04 ENCOUNTER — Encounter (HOSPITAL_COMMUNITY): Payer: Self-pay

## 2013-10-04 ENCOUNTER — Encounter (HOSPITAL_COMMUNITY)
Admission: RE | Admit: 2013-10-04 | Discharge: 2013-10-04 | Disposition: A | Discharge status: Home / Self Care | Payer: BC Managed Care – PPO | Source: Ambulatory Visit | Attending: General Surgery | Admitting: General Surgery

## 2013-10-04 ENCOUNTER — Encounter: Payer: Self-pay | Admitting: Physician Assistant

## 2013-10-04 VITALS — BP 116/74 | HR 64 | Temp 98.3°F | Resp 16 | Ht 65.0 in | Wt 155.0 lb

## 2013-10-04 VITALS — BP 116/84 | HR 64 | Temp 98.1°F | Resp 16 | Ht 65.0 in | Wt 160.0 lb

## 2013-10-04 DIAGNOSIS — S92425D Nondisplaced fracture of distal phalanx of left great toe, subsequent encounter for fracture with routine healing: Secondary | ICD-10-CM

## 2013-10-04 DIAGNOSIS — Z01812 Encounter for preprocedural laboratory examination: Secondary | ICD-10-CM | POA: Insufficient documentation

## 2013-10-04 DIAGNOSIS — R220 Localized swelling, mass and lump, head: Secondary | ICD-10-CM

## 2013-10-04 DIAGNOSIS — S8290XD Unspecified fracture of unspecified lower leg, subsequent encounter for closed fracture with routine healing: Secondary | ICD-10-CM

## 2013-10-04 HISTORY — DX: Headache: R51

## 2013-10-04 LAB — BASIC METABOLIC PANEL
BUN: 12 mg/dL (ref 6–23)
CO2: 24 mEq/L (ref 19–32)
Calcium: 10.2 mg/dL (ref 8.4–10.5)
Chloride: 101 mEq/L (ref 96–112)
Creatinine, Ser: 0.65 mg/dL (ref 0.50–1.10)
GFR calc non Af Amer: >90 mL/min (ref >90)
Glucose, Bld: 87 mg/dL (ref 70–99)
Potassium: 4 mEq/L (ref 3.5–5.1)

## 2013-10-04 LAB — CBC
HCT: 41.4 % (ref 36.0–46.0)
Hemoglobin: 13.4 g/dL (ref 12.0–15.0)
MCH: 25.5 pg — ABNORMAL LOW (ref 26.0–34.0)
MCHC: 32.4 g/dL (ref 30.0–36.0)
MCV: 78.9 fL (ref 78.0–100.0)
RBC: 5.25 MIL/uL — ABNORMAL HIGH (ref 3.87–5.11)

## 2013-10-04 LAB — HCG, SERUM, QUALITATIVE: Preg, Serum: NEGATIVE

## 2013-10-04 NOTE — Progress Notes (Signed)
Anesthesia to see patient am of surgery.

## 2013-10-04 NOTE — Assessment & Plan Note (Signed)
Pain improving.  Continue with conservative therapy and orthotic support.  Follow-up in 2 weeks.

## 2013-10-04 NOTE — Progress Notes (Signed)
Patient ID: [name], female   DOB: [birth year], 37 y.o.   MRN: [id number]  Patient presents to clinic today for follow-up of Left great toe fracture.   Patient states she has been doing well overall.  Bruising and swelling have subsided.  Pain only when putting most of her weight on the affected toe.  Occassional tylenol/ibuprofen for pain.  Continues to wear orthotic shoe and buddy tape her toes.  No other concerns today.  Past Medical History  Diagnosis Date  . Anemia   . Blood in stool   . Chicken pox   . Diverticulitis   . Hypertension   . Colon polyps   . Obesity   . Headache(784.0)     migraines    Current Outpatient Prescriptions on File Prior to Visit  Medication Sig Dispense Refill  . Calcium 500 MG CHEW Chew 500 mg by mouth 3 (three) times daily.       . Cyanocobalamin (VITAMIN B-12) 1000 MCG SUBL Place 1,000 mcg under the tongue every morning.       Marland Kitchen levonorgestrel (MIRENA) 20 MCG/24HR IUD 1 each by Intrauterine route once.      . Multiple Vitamins-Minerals (MULTIVITAL) CHEW Chew 1 each by mouth 2 (two) times daily.      Marland Kitchen oxymetazoline (AFRIN) 0.05 % nasal spray Place 2 sprays into the nose 2 (two) times daily.       No current facility-administered medications on file prior to visit.    No Known Allergies  Family History  Problem Relation Age of Onset  . Stroke      family hx  . Crohn's disease Father   . Lupus Father   . Ulcerative colitis Father   . Cancer Other     breast    History   Social History  . Marital Status: Married    Spouse Name: [name]    Number of Children: [name]  . Years of Education: [name]   Social History Main Topics  . Smoking status: Former Smoker -- 0.25 packs/day for 2 years    Types: Cigarettes    Quit date: 06/11/1996  . Smokeless tobacco: Never Used  . Alcohol Use: No  . Drug Use: No  . Sexual Activity: None   Other Topics Concern  . None   Social History Narrative  . None   Review of Systems  Constitutional:  Negative for fever, chills, weight loss and malaise/fatigue.  Musculoskeletal: Positive for joint pain. Negative for falls.  Neurological: Negative for tingling and sensory change.   Filed Vitals:   10/04/13 0829  BP: 116/84  Pulse: 64  Temp: 98.1 F (36.7 C)  Resp: 16    Physical Exam  Vitals reviewed. Constitutional: She is oriented to person, place, and time and well-developed, well-nourished, and in no distress.  HENT:  Head: Normocephalic and atraumatic.  Eyes: Conjunctivae are normal.  Cardiovascular: Normal rate and regular rhythm.   Pulses:      Dorsalis pedis pulses are 2+ on the right side, and 2+ on the left side.       Posterior tibial pulses are 2+ on the right side, and 2+ on the left side.  Musculoskeletal:       Left ankle: Normal.       Left foot: She exhibits bony tenderness. She exhibits no tenderness, no swelling, normal capillary refill, no crepitus, no deformity and no laceration.  Neurological: She is alert and oriented to person, place, and time.  Skin: Skin is warm  and dry. No rash noted.     Recent Results (from the past 2160 hour(s))  CBC     Status: Abnormal   Collection Time    10/04/13 10:45 AM      Result Value Range   WBC 6.3  4.0 - 10.5 K/uL   RBC 5.25 (*) 3.87 - 5.11 MIL/uL   Hemoglobin 13.4  12.0 - 15.0 g/dL   HCT 16.1  09.6 - 04.5 %   MCV 78.9  78.0 - 100.0 fL   MCH 25.5 (*) 26.0 - 34.0 pg   MCHC 32.4  30.0 - 36.0 g/dL   RDW 40.9  81.1 - 91.4 %   Platelets 304  150 - 400 K/uL    Assessment/Plan: Nondisp fx of distal phalanx of left great toe with routine healing Pain improving.  Continue with conservative therapy and orthotic support.  Follow-up in 2 weeks.

## 2013-10-10 ENCOUNTER — Encounter (HOSPITAL_COMMUNITY): Payer: BC Managed Care – PPO | Admitting: Anesthesiology

## 2013-10-10 ENCOUNTER — Encounter (HOSPITAL_COMMUNITY): Payer: Self-pay

## 2013-10-10 ENCOUNTER — Ambulatory Visit (HOSPITAL_COMMUNITY): Payer: BC Managed Care – PPO | Admitting: Anesthesiology

## 2013-10-10 ENCOUNTER — Encounter (HOSPITAL_COMMUNITY)
Admission: RE | Disposition: A | Discharge status: Home / Self Care | Payer: Self-pay | Source: Ambulatory Visit | Attending: General Surgery

## 2013-10-10 ENCOUNTER — Ambulatory Visit (HOSPITAL_COMMUNITY)
Admission: RE | Admit: 2013-10-10 | Discharge: 2013-10-10 | Disposition: A | Discharge status: Home / Self Care | Payer: BC Managed Care – PPO | Source: Ambulatory Visit | Attending: General Surgery | Admitting: General Surgery

## 2013-10-10 DIAGNOSIS — L7211 Pilar cyst: Secondary | ICD-10-CM

## 2013-10-10 DIAGNOSIS — R220 Localized swelling, mass and lump, head: Secondary | ICD-10-CM

## 2013-10-10 DIAGNOSIS — Z9884 Bariatric surgery status: Secondary | ICD-10-CM | POA: Insufficient documentation

## 2013-10-10 HISTORY — DX: Unspecified fracture of unspecified toe(s), initial encounter for closed fracture: S92.919A

## 2013-10-10 HISTORY — PX: EAR CYST EXCISION: SHX22

## 2013-10-10 HISTORY — DX: Other seasonal allergic rhinitis: J30.2

## 2013-10-10 SURGERY — CYST REMOVAL
Anesthesia: General | Wound class: Clean

## 2013-10-10 MED ORDER — LIDOCAINE-EPINEPHRINE (PF) 1 %-1:200000 IJ SOLN
INTRAMUSCULAR | Status: AC
  Filled 2013-10-10: qty 10

## 2013-10-10 MED ORDER — FENTANYL CITRATE 0.05 MG/ML IJ SOLN
INTRAMUSCULAR | Status: DC | PRN
  Administered 2013-10-10 (×2): 50 ug via INTRAVENOUS
  Administered 2013-10-10: 150 ug via INTRAVENOUS

## 2013-10-10 MED ORDER — PROMETHAZINE HCL 25 MG/ML IJ SOLN: 6.2500 mg | INTRAMUSCULAR | Status: DC | PRN

## 2013-10-10 MED ORDER — NEOSTIGMINE METHYLSULFATE 1 MG/ML IJ SOLN
INTRAMUSCULAR | Status: DC | PRN
  Administered 2013-10-10: 3 mg via INTRAVENOUS

## 2013-10-10 MED ORDER — BACITRACIN ZINC 500 UNIT/GM EX OINT
TOPICAL_OINTMENT | CUTANEOUS | Status: DC | PRN
  Administered 2013-10-10: 1 via TOPICAL

## 2013-10-10 MED ORDER — CEFAZOLIN SODIUM-DEXTROSE 2-3 GM-% IV SOLR
2.0000 g | INTRAVENOUS | Status: AC
  Administered 2013-10-10: 2 g via INTRAVENOUS

## 2013-10-10 MED ORDER — DEXAMETHASONE SODIUM PHOSPHATE 10 MG/ML IJ SOLN
INTRAMUSCULAR | Status: DC | PRN
  Administered 2013-10-10: 8 mg via INTRAVENOUS

## 2013-10-10 MED ORDER — CEFAZOLIN SODIUM-DEXTROSE 2-3 GM-% IV SOLR
INTRAVENOUS | Status: AC
Start: 2013-10-10 — End: 2013-10-10
  Filled 2013-10-10: qty 50

## 2013-10-10 MED ORDER — BACITRACIN ZINC 500 UNIT/GM EX OINT
TOPICAL_OINTMENT | CUTANEOUS | Status: AC
  Filled 2013-10-10: qty 28.35

## 2013-10-10 MED ORDER — GLYCOPYRROLATE 0.2 MG/ML IJ SOLN
INTRAMUSCULAR | Status: DC | PRN
  Administered 2013-10-10: .4 mg via INTRAVENOUS

## 2013-10-10 MED ORDER — LACTATED RINGERS IV SOLN
INTRAVENOUS | Status: DC | PRN
  Administered 2013-10-10 (×2): via INTRAVENOUS

## 2013-10-10 MED ORDER — MIDAZOLAM HCL 5 MG/5ML IJ SOLN
INTRAMUSCULAR | Status: DC | PRN
  Administered 2013-10-10: 2 mg via INTRAVENOUS

## 2013-10-10 MED ORDER — PROPOFOL 10 MG/ML IV BOLUS
INTRAVENOUS | Status: DC | PRN
  Administered 2013-10-10: 150 mg via INTRAVENOUS

## 2013-10-10 MED ORDER — SUCCINYLCHOLINE CHLORIDE 20 MG/ML IJ SOLN
INTRAMUSCULAR | Status: DC | PRN
  Administered 2013-10-10: 80 mg via INTRAVENOUS

## 2013-10-10 MED ORDER — LIDOCAINE-EPINEPHRINE (PF) 1 %-1:200000 IJ SOLN
INTRAMUSCULAR | Status: DC | PRN
  Administered 2013-10-10: 4 mL

## 2013-10-10 MED ORDER — LACTATED RINGERS IV SOLN: INTRAVENOUS | Status: DC

## 2013-10-10 MED ORDER — CISATRACURIUM BESYLATE (PF) 10 MG/5ML IV SOLN
INTRAVENOUS | Status: DC | PRN
  Administered 2013-10-10: 4 mg via INTRAVENOUS

## 2013-10-10 MED ORDER — BUPIVACAINE HCL (PF) 0.25 % IJ SOLN
INTRAMUSCULAR | Status: DC | PRN
  Administered 2013-10-10: 4 mL

## 2013-10-10 MED ORDER — ONDANSETRON HCL 4 MG/2ML IJ SOLN
INTRAMUSCULAR | Status: DC | PRN
  Administered 2013-10-10: 4 mg via INTRAVENOUS

## 2013-10-10 MED ORDER — 0.9 % SODIUM CHLORIDE (POUR BTL) OPTIME
TOPICAL | Status: DC | PRN
  Administered 2013-10-10: 1000 mL

## 2013-10-10 MED ORDER — BUPIVACAINE HCL (PF) 0.25 % IJ SOLN
INTRAMUSCULAR | Status: AC
  Filled 2013-10-10: qty 30

## 2013-10-10 MED ORDER — LIDOCAINE HCL (CARDIAC) 20 MG/ML IV SOLN
INTRAVENOUS | Status: DC | PRN
  Administered 2013-10-10: 100 mg via INTRAVENOUS

## 2013-10-10 MED ORDER — HYDROMORPHONE HCL PF 1 MG/ML IJ SOLN: 0.2500 mg | INTRAMUSCULAR | Status: DC | PRN

## 2013-10-10 SURGICAL SUPPLY — 41 items
BANDAGE GAUZE ELAST BULKY 4 IN (GAUZE/BANDAGES/DRESSINGS) ×2 IMPLANT
BLADE HEX COATED 2.75 (ELECTRODE) ×2 IMPLANT
BLADE SURG SZ10 CARB STEEL (BLADE) ×2 IMPLANT
CANISTER SUCTION 2500CC (MISCELLANEOUS) ×2 IMPLANT
CLOTH BEACON ORANGE TIMEOUT ST (SAFETY) IMPLANT
DECANTER SPIKE VIAL GLASS SM (MISCELLANEOUS) ×2 IMPLANT
DERMABOND ADVANCED (GAUZE/BANDAGES/DRESSINGS)
DERMABOND ADVANCED .7 DNX12 (GAUZE/BANDAGES/DRESSINGS) IMPLANT
DRAPE LAPAROSCOPIC ABDOMINAL (DRAPES) IMPLANT
DRAPE LAPAROTOMY T 102X78X121 (DRAPES) IMPLANT
DRAPE LAPAROTOMY TRNSV 102X78 (DRAPE) ×2 IMPLANT
DRAPE LG THREE QUARTER DISP (DRAPES) IMPLANT
DRAPE UTILITY XL STRL (DRAPES) ×2 IMPLANT
ELECT REM PT RETURN 9FT ADLT (ELECTROSURGICAL) ×2
ELECTRODE REM PT RTRN 9FT ADLT (ELECTROSURGICAL) ×1 IMPLANT
GLOVE BIO SURGEON STRL SZ7 (GLOVE) IMPLANT
GLOVE BIOGEL PI IND STRL 7.0 (GLOVE) ×1 IMPLANT
GLOVE BIOGEL PI INDICATOR 7.0 (GLOVE) ×1
GLOVE SURG SS PI 7.5 STRL IVOR (GLOVE) ×4 IMPLANT
GOWN PREVENTION PLUS LG XLONG (DISPOSABLE) IMPLANT
GOWN STRL REIN XL XLG (GOWN DISPOSABLE) ×4 IMPLANT
KIT BASIN OR (CUSTOM PROCEDURE TRAY) ×2 IMPLANT
MARKER SKIN DUAL TIP RULER LAB (MISCELLANEOUS) ×2 IMPLANT
NEEDLE HYPO 25X1 1.5 SAFETY (NEEDLE) ×2 IMPLANT
NS IRRIG 1000ML POUR BTL (IV SOLUTION) ×2 IMPLANT
PACK BASIC VI WITH GOWN DISP (CUSTOM PROCEDURE TRAY) ×2 IMPLANT
PENCIL BUTTON HOLSTER BLD 10FT (ELECTRODE) ×2 IMPLANT
SPONGE GAUZE 4X4 12PLY (GAUZE/BANDAGES/DRESSINGS) ×2 IMPLANT
SPONGE LAP 18X18 X RAY DECT (DISPOSABLE) IMPLANT
SPONGE LAP 4X18 X RAY DECT (DISPOSABLE) ×2 IMPLANT
STAPLER VISISTAT 35W (STAPLE) IMPLANT
SUCTION FRAZIER TIP 10 FR DISP (SUCTIONS) ×2 IMPLANT
SUT MNCRL AB 4-0 PS2 18 (SUTURE) ×2 IMPLANT
SUT PROLENE 3 0 PS 2 (SUTURE) ×4 IMPLANT
SUT VIC AB 2-0 SH 27 (SUTURE) ×1
SUT VIC AB 2-0 SH 27X BRD (SUTURE) ×1 IMPLANT
SUT VIC AB 3-0 SH 18 (SUTURE) IMPLANT
SYR CONTROL 10ML LL (SYRINGE) ×2 IMPLANT
TOWEL OR 17X26 10 PK STRL BLUE (TOWEL DISPOSABLE) ×2 IMPLANT
TOWEL OR NON WOVEN STRL DISP B (DISPOSABLE) ×2 IMPLANT
YANKAUER SUCT BULB TIP 10FT TU (MISCELLANEOUS) ×2 IMPLANT

## 2013-10-10 NOTE — H&P (View-Only) (Signed)
Subjective:     Patient ID: [name], female   DOB: August 16, 1976, 37 y.o.   MRN: [id number]  HPI This patient is a 3 month status post vertical sleeve gastrectomy for obesity. She has no complaints and is doing remarkably well. She is down approaching her goal of 140 pounds. She is taking any protein at about 60 g per day and has a little bit of constipation but bowels are functioning. She has no food intolerance or nausea or vomiting or reflux. She has been under control and is taking her vitamins. She is off all her blood pressure medications and really has no complaints. She has not been doing much exercise. She also complains of several cysts on her scalp 2 in the forehead region which had been increasing in size and 2 on the left side of her scalp which are smaller. She says that these are itchy and have been increasing in size and she would like to have it removed  Review of Systems     Objective:   Physical Exam She is in no acute distress and nontoxic-appearing She has two 1/2-2 cm Scalp cyst just at her hairline of her forehead of as well as 2 smaller nodules on the left parietal region of her scalp     Assessment:     Status post vertical sleeve gastrectomy has been doing well She's doing very well from her last procedure and hasn't had good weight loss. She has no evidence of any postoperative complications and is approaching her goal weight. I recommend that she continue with his bariatric diet and increase her physical activities in order to obtain the additional 20 pound weight loss that she requires to reach her goals. 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 I discussed with her the procedure and the risks including infection bleeding, pain, scarring, recurrence of, poor cosmesis, and the need to shave the head of the possibility of lack of hair growth in this area She expressed Understanding and  would like to proceed with excision of scalp cyst    Plan:     We will set her up for excision of scalp cysts We will also check some nutrition labs and will see her back in about 3 months at her 6 month visit

## 2013-10-10 NOTE — Preoperative (Signed)
Beta Blockers   Reason not to administer Beta Blockers:Not Applicable 

## 2013-10-10 NOTE — Anesthesia Procedure Notes (Signed)
Procedure Name: Intubation Date/Time: 10/10/2013 8:56 AM Performed by: Leroy Libman L Patient Re-evaluated:Patient Re-evaluated prior to inductionOxygen Delivery Method: Circle system utilized Preoxygenation: Pre-oxygenation with 100% oxygen Intubation Type: IV induction Ventilation: Mask ventilation without difficulty Laryngoscope Size: Miller and 2 Grade View: Grade I Tube type: Oral Tube size: 7.5 mm Number of attempts: 1 Airway Equipment and Method: Stylet Placement Confirmation: ETT inserted through vocal cords under direct vision,  breath sounds checked- equal and bilateral and positive ETCO2 Secured at: 21 cm Tube secured with: Tape Dental Injury: Teeth and Oropharynx as per pre-operative assessment

## 2013-10-10 NOTE — Brief Op Note (Signed)
10/10/2013  10:07 AM  PATIENT:  [name]  37 y.o. female  PRE-OPERATIVE DIAGNOSIS:  scalp cyst  POST-OPERATIVE DIAGNOSIS:  scalp cyst  PROCEDURE:  Procedure(s):  REMOVAL Scalp masses ([name])  SURGEON:  Surgeon(s) and Role:    * Lodema Pilot, DO - Primary  PHYSICIAN ASSISTANT:   ASSISTANTS: none   ANESTHESIA:   general  EBL:  Total I/O In: 1000 [I.V.:1000] Out: 25 [Blood:25]  BLOOD ADMINISTERED:none  DRAINS: none   LOCAL MEDICATIONS USED:  MARCAINE    and LIDOCAINE   SPECIMEN:  Source of Specimen:  forehead cyst x2, left scalp cyst  DISPOSITION OF SPECIMEN:  PATHOLOGY  COUNTS:  YES  TOURNIQUET:  * No tourniquets in log *  DICTATION: .Other Dictation: Dictation Number [id number]  PLAN OF CARE: Discharge to home after PACU  PATIENT DISPOSITION:  PACU - hemodynamically stable.   Delay start of Pharmacological VTE agent (>24hrs) due to surgical blood loss or risk of bleeding: no

## 2013-10-10 NOTE — Anesthesia Preprocedure Evaluation (Addendum)
Anesthesia Evaluation  Patient identified by MRN, date of birth, ID band Patient awake    Reviewed: Allergy & Precautions, H&P , NPO status , Patient's Chart, lab work & pertinent test results  Airway Mallampati: II TM Distance: >3 FB Neck ROM: Full    Dental  (+) Teeth Intact and Dental Advisory Given   Pulmonary neg pulmonary ROS, former smoker,  breath sounds clear to auscultation  Pulmonary exam normal       Cardiovascular Rhythm:Regular Rate:Normal     Neuro/Psych  Headaches, negative neurological ROS  negative psych ROS   GI/Hepatic negative GI ROS, Neg liver ROS,   Endo/Other  negative endocrine ROS  Renal/GU negative Renal ROS  negative genitourinary   Musculoskeletal negative musculoskeletal ROS (+)   Abdominal   Peds  Hematology negative hematology ROS (+)   Anesthesia Other Findings   Reproductive/Obstetrics negative OB ROS                          Anesthesia Physical Anesthesia Plan  ASA: I  Anesthesia Plan: General   Post-op Pain Management:    Induction: Intravenous  Airway Management Planned: Oral ETT  Additional Equipment:   Intra-op Plan:   Post-operative Plan: Extubation in OR  Informed Consent: I have reviewed the patients History and Physical, chart, labs and discussed the procedure including the risks, benefits and alternatives for the proposed anesthesia with the patient or authorized representative who has indicated his/her understanding and acceptance.   Dental advisory given  Plan Discussed with: CRNA  Anesthesia Plan Comments:         Anesthesia Quick Evaluation

## 2013-10-10 NOTE — Progress Notes (Signed)
Patient has Roxicodone liquid pain med at home that Dr Biagio Quint told she and her husband to use as PRN med for pain.

## 2013-10-10 NOTE — Interval H&P Note (Signed)
History and Physical Interval Note:  10/10/2013 8:41 AM  Page Spiro  has presented today for surgery, with the diagnosis of scalp cyst  The various methods of treatment have been discussed with the patient and family. After consideration of risks, benefits and other options for treatment, the patient has consented to  Procedure(s): CYST REMOVAL Scalp ([name]) as a surgical intervention .  The patient's history has been reviewed, patient examined, no change in status, stable for surgery.  I have reviewed the patient's chart and labs.  Questions were answered to the patient's satisfaction.  I have seen and evaluated her in the preop area and the sites were marked again with the patient.  I explained the risks of the procedure including infection, bleeding, pain, scarring, recurrence, poor cosmesis, need for repeat surgery or reexcision, and need to shave the hair in the area.  She expressed understanding and desires to proceed with excision of forehead mass x2 and excision of left scalp mass   Dartanyan Deasis DAVID

## 2013-10-10 NOTE — Transfer of Care (Signed)
Immediate Anesthesia Transfer of Care Note  Patient: [name]  Procedure(s) Performed: Procedure(s):  REMOVAL Scalp masses ([name])  Patient Location: PACU  Anesthesia Type:General  Level of Consciousness: awake, alert  and oriented  Airway & Oxygen Therapy: Patient Spontanous Breathing and Patient connected to face mask oxygen  Post-op Assessment: Report given to PACU RN and Post -op Vital signs reviewed and stable  Post vital signs: Reviewed and stable  Complications: No apparent anesthesia complications

## 2013-10-10 NOTE — Anesthesia Postprocedure Evaluation (Signed)
Anesthesia Post Note  Patient: [name]  Procedure(s) Performed: Procedure(s) (LRB):  REMOVAL Scalp masses ([name])  Anesthesia type: General  Patient location: PACU  Post pain: Pain level controlled  Post assessment: Post-op Vital signs reviewed  Last Vitals:  Filed Vitals:   10/10/13 1147  BP: 118/76  Pulse: 80  Temp:   Resp: 16    Post vital signs: Reviewed  Level of consciousness: sedated  Complications: No apparent anesthesia complications

## 2013-10-11 ENCOUNTER — Encounter (HOSPITAL_COMMUNITY): Payer: Self-pay | Admitting: General Surgery

## 2013-10-11 NOTE — Op Note (Signed)
[name], [name]               ACCOUNT NO.:  [id number]  MEDICAL RECORD NO.:  [id number]  LOCATION:  [facility]                         FACILITY:  [facility]  PHYSICIAN:  Lodema Pilot, MD       DATE OF BIRTH:  1976/07/12  DATE OF PROCEDURE:  10/10/2013 DATE OF DISCHARGE:  10/10/2013                              OPERATIVE REPORT   PROCEDURE:  Excision of forehead scalp cyst x2, and left scalp cyst.  SURGEON:  Lodema Pilot, MD  ASSISTANT:  None.  ANESTHESIA:  General endotracheal tube anesthesia with 9 mL of 1% lidocaine with epinephrine and 0.25% Marcaine in a 50:50 mixture.  FLUIDS:  1 L of crystalloid.  ESTIMATED BLOOD LOSS:  Minimal.  DRAINS:  None.  SPECIMENS: 1. Left posterior scalp cyst measuring 1 cm. 2. Left forehead scalp cyst measuring 2 cm. 3. Right forehead scalp cysts measuring 2 cm.  All sent to Pathology     for permanent sectioning.  COMPLICATIONS:  None apparent.  FINDINGS:  Epidermal cyst consistent with pilar cyst.  INDICATION FOR PROCEDURE:  [name] is a 37 year old female, known to me from prior weight loss surgery.  She has done well with her surgery, but returns with enlarging scalp cyst on her forehead and her left posterior scalp.  She desires surgical biopsy and removal of these lesions.  OPERATIVE DETAILS:  [name] was seen and evaluated in the preoperative area and risks and benefits of procedure were again discussed in lay terms.  Informed consent was obtained.  The surgical sites were marked with the patient prior to anesthetic administration and I explained that I would not be able to remove the most posterior cyst today due to positioning and we agreed to remove the left lateral scalp cyst and the two forehead scalp cysts.  She was given prophylactic antibiotics and taken to the operating room, placed on the table in supine position. General endotracheal tube anesthesia was obtained.  The forehead and scalp were prepped and draped in a  standard surgical fashion and procedure time-out was performed with all operative team members.  We tried to shave minimal area as possible and the hair was clipped back around the area with bacitracin ointment to keep the hair out of the wound.  Elliptical incision was made transversely over the forehead scalp lesions and the cysts were dissected circumferentially avoiding entry into the cyst capsule.  Capsules were actually well defined and was able to dissect along the cyst and completely removed the cyst capsule from both of the lesions, they were kept separate and sent as both the left and right forehead scalp cyst.  Each of them measured 2 cm in diameter.  Similar excision was performed on the left posterior scalp cyst with an elliptical incision and the cyst was easily removed as this was the much smaller cyst measuring less than 1 cm in diameter.  The skin edges were approximated and closed in similar fashion on all incisions.  The 2-0 Vicryl suture was used to approximate the dermis. After the wounds were injected with total of 9 mL of 1% lidocaine with epinephrine and 0.25% Marcaine in a 50:50 mixture, the wounds were irrigated  and hemostasis was obtained with Bovie electrocautery and 2-0 Vicryl suture was used to approximate the dermis and the skin edges were then approximated with interrupted 3-0 sutures.  The wounds were well approximated and the area was washed and dried.  Bacitracin ointment was applied and sterile dressing was applied.  All sponge, needle, and instrument counts were correct at the end of the case.  The patient tolerated the procedure well without apparent complication.          ______________________________ Lodema Pilot, MD     BL/MEDQ  D:  10/10/2013  T:  10/11/2013  Job:  [id number]

## 2013-10-15 ENCOUNTER — Telehealth (INDEPENDENT_AMBULATORY_CARE_PROVIDER_SITE_OTHER): Payer: Self-pay | Admitting: *Deleted

## 2013-10-15 ENCOUNTER — Ambulatory Visit (INDEPENDENT_AMBULATORY_CARE_PROVIDER_SITE_OTHER): Payer: BC Managed Care – PPO | Admitting: General Surgery

## 2013-10-15 ENCOUNTER — Encounter (INDEPENDENT_AMBULATORY_CARE_PROVIDER_SITE_OTHER): Payer: Self-pay | Admitting: General Surgery

## 2013-10-15 VITALS — BP 118/84 | HR 68 | Temp 97.6°F | Resp 16 | Ht 65.0 in | Wt 155.6 lb

## 2013-10-15 DIAGNOSIS — L270 Generalized skin eruption due to drugs and medicaments taken internally: Secondary | ICD-10-CM

## 2013-10-15 DIAGNOSIS — Z09 Encounter for follow-up examination after completed treatment for conditions other than malignant neoplasm: Secondary | ICD-10-CM

## 2013-10-15 DIAGNOSIS — I10 Essential (primary) hypertension: Secondary | ICD-10-CM

## 2013-10-15 MED ORDER — METHYLPREDNISOLONE (PAK) 4 MG PO TABS: ORAL_TABLET | ORAL | Status: DC

## 2013-10-15 NOTE — Telephone Encounter (Signed)
I called pt to inform her of her appt with Carrolyn Leigh at [facility] on 10/16/13 with an arrival time of 2:30pm.  I informed pt of their address and phone number.  Pt agreeable.  [facility] [address]. [place], [place] [phone]

## 2013-10-15 NOTE — Progress Notes (Signed)
Subjective:     Patient ID: [name], female   DOB: [birth year], 37 y.o.   MRN: [id number]  HPI This patient follows up 5 days status post excision of scalp lesions. 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 She says that about 2 days after her procedure she developed itching and drainage from the area of the excisions. She denies any fevers or chills.  Review of Systems     Objective:   Physical Exam In the area of the incisions she has scaling and crusting and a serous drainage. The wounds actually look okay and do not appear to be infected. This appears to be more of an allergic reaction     Assessment:     Status post excision of scalp lesions Allergic rash I'm not certain what is causing this crusting rash. I think that this is most likely allergy related. It does not appear to be infected I do not think that she needs any antibiotics but I would like her to see a dermatologist and we will try to refer her as soon as possible. We will also try a Medrol Dosepak to see if she gets any improvement with this. Her pathology was benign. We will keep her appointment for 2 days from now so we can remove her sutures at that time. We will also give the followup and see how her rash is doing     Plan:     Medrol Dosepak Followup in 2 days for suture removal dermatology referral

## 2013-10-15 NOTE — Addendum Note (Signed)
Addended by: Maryan Puls on: 10/15/2013 03:32 PM   Modules accepted: Orders

## 2013-10-19 ENCOUNTER — Ambulatory Visit (INDEPENDENT_AMBULATORY_CARE_PROVIDER_SITE_OTHER): Payer: BC Managed Care – PPO

## 2013-10-19 NOTE — Progress Notes (Unsigned)
Patient in for nurse only scalp suture removal.Dr Biagio Quint noticed patient  was here . He assessed the patient and  removed  Sutures. Advised patient to call if any temp 100.3 +, drainage,redness.To shower as usual. Patient verbalized understanding

## 2013-10-31 ENCOUNTER — Ambulatory Visit (INDEPENDENT_AMBULATORY_CARE_PROVIDER_SITE_OTHER): Payer: BC Managed Care – PPO | Admitting: General Surgery

## 2013-10-31 ENCOUNTER — Telehealth (INDEPENDENT_AMBULATORY_CARE_PROVIDER_SITE_OTHER): Payer: Self-pay | Admitting: General Surgery

## 2013-10-31 NOTE — Telephone Encounter (Signed)
I called to see how she was doing.  She says that her wounds are looking great.  The rash has almost completely resolved.  She has a little dry skin and scaling in the front but otherwise no complaints.  She is happy with the cosmesis and says that her hair is growing in quick.  She will call me as needed and follow up for her routine bariatric visits.  We can recheck the wounds at that time if not completely healed.

## 2013-11-12 ENCOUNTER — Encounter: Payer: Self-pay | Admitting: Family

## 2013-11-12 ENCOUNTER — Ambulatory Visit (INDEPENDENT_AMBULATORY_CARE_PROVIDER_SITE_OTHER): Payer: BC Managed Care – PPO | Admitting: Family

## 2013-11-12 VITALS — BP 110/82 | HR 69 | Temp 97.5°F | Resp 16 | Ht 65.5 in | Wt 152.1 lb

## 2013-11-12 DIAGNOSIS — J329 Chronic sinusitis, unspecified: Secondary | ICD-10-CM

## 2013-11-12 MED ORDER — FLUTICASONE PROPIONATE 50 MCG/ACT NA SUSP: 2.0000 | Freq: Every day | NASAL | Status: DC

## 2013-11-12 MED ORDER — AMOXICILLIN-POT CLAVULANATE 875-125 MG PO TABS: 1.0000 | ORAL_TABLET | Freq: Two times a day (BID) | ORAL | Status: DC

## 2013-11-12 NOTE — Progress Notes (Signed)
Subjective:    Patient ID: [name], female    DOB: [birth year], 37 y.o.   MRN: [id number]  HPI  [name] is a 37 yr old female who presents today with chief complaint of nasal congestion. Reports that congestion started several weeks ago.  Reports that initially afrin and mucinex were helping her symptoms.  No longer helping.  Now using saline rinses without improvement. Denies associated fever.  Some yellow drainage.  + frontal sinus pressure.  Reports some back aching  + sneezing. Energy is fair.    Had recent removal of scalp cysts.  Pt notes a "black spot" in the incision on the left anterior forehead. Initially was sore.    Review of Systems See HPI  Past Medical History  Diagnosis Date  . Anemia   . Blood in stool   . Chicken pox   . Diverticulitis   . Hypertension   . Colon polyps   . Obesity   . Headache(784.0)     migraines  . Fractured toe 09/2013    left big toe about 3 weeks ago  . Seasonal allergies     Stuffy nose    History   Social History  . Marital Status: Married    Spouse Name: [name]    Number of Children: [name]  . Years of Education: [name]   Occupational History  . Not on file.   Social History Main Topics  . Smoking status: Former Smoker -- 0.25 packs/day for 2 years    Types: Cigarettes    Quit date: 06/11/1996  . Smokeless tobacco: Never Used  . Alcohol Use: No  . Drug Use: No  . Sexual Activity: Not on file   Other Topics Concern  . Not on file   Social History Narrative  . No narrative on file    Past Surgical History  Procedure Laterality Date  . Cesarean section  2006  . Laparoscopic cholecystectomy  2012  . Laparoscopic gastric sleeve resection [name] 06/19/2013    Procedure: LARAPROSCOPIC SLEEVE GASTRECTOMY WITH EGD;  Surgeon: Lodema Pilot, DO;  Location: [facility];  Service: General;  Laterality: [name];  LARAPROSCOPIC SLEEVE GASTRECTOMY WITH EGD   . Esophagogastroduodenoscopy [name] 06/19/2013    Procedure: ESOPHAGOGASTRODUODENOSCOPY  (EGD);  Surgeon: Lodema Pilot, DO;  Location: [facility];  Service: General;  Laterality: [name];  . Ear cyst excision [name] 10/10/2013    Procedure:  REMOVAL Scalp masses;  Surgeon: Lodema Pilot, DO;  Location: [facility];  Service: General;  Laterality: [name];    Family History  Problem Relation Age of Onset  . Stroke      family hx  . Crohn's disease Father   . Lupus Father   . Ulcerative colitis Father   . Cancer Other     breast    No Known Allergies  Current Outpatient Prescriptions on File Prior to Visit  Medication Sig Dispense Refill  . Calcium 500 MG CHEW Chew 500 mg by mouth 3 (three) times daily.       . Cyanocobalamin (VITAMIN B-12) 1000 MCG SUBL Place 1,000 mcg under the tongue every morning.       Marland Kitchen levonorgestrel (MIRENA) 20 MCG/24HR IUD 1 each by Intrauterine route once.      . Multiple Vitamins-Minerals (MULTIVITAL) CHEW Chew 1 each by mouth 2 (two) times daily.      Marland Kitchen oxymetazoline (AFRIN) 0.05 % nasal spray Place 2 sprays into the nose 2 (two) times daily.       No  current facility-administered medications on file prior to visit.    BP 110/82  Pulse 69  Temp(Src) 97.5 F (36.4 C) (Oral)  Resp 16  Ht 5' 5.5" (1.664 m)  Wt 152 lb 1.9 oz (69.001 kg)  BMI 24.92 kg/m2  SpO2 99%       Objective:   Physical Exam  Constitutional: She is oriented to person, place, and time. She appears well-developed and well-nourished. No distress.  HENT:  Head: Normocephalic and atraumatic.  Right Ear: Tympanic membrane and ear canal normal.  Left Ear: Tympanic membrane and ear canal normal.  Mouth/Throat: Posterior oropharyngeal erythema present. No oropharyngeal exudate or posterior oropharyngeal edema.  Cardiovascular: Normal rate and regular rhythm.   No murmur heard. Pulmonary/Chest: Effort normal and breath sounds normal. No respiratory distress. She has no wheezes. She has no rales.  Musculoskeletal: She exhibits no edema.  Neurological: She is alert and oriented to person,  place, and time.  Psychiatric: She has a normal mood and affect. Her behavior is normal. Judgment and thought content normal.  skin: small black raised area in middle of scalp suture left. Scalp incisions are clean, dry well healed.         Assessment & Plan:  ?retained suture left anterior scalp suture.  Attempted to remove gently using sterile needle.  Unable to remove any material from the incision.   Advised pt to monitor the area and call if redness/pain/swelling.

## 2013-11-12 NOTE — Progress Notes (Signed)
Pre visit review using our clinic review tool, if applicable. No additional management support is needed unless otherwise documented below in the visit note. 

## 2013-11-12 NOTE — Assessment & Plan Note (Signed)
Resume zyrtec, add flonase, d/c afrin.

## 2013-11-12 NOTE — Patient Instructions (Signed)

## 2013-11-12 NOTE — Assessment & Plan Note (Signed)
Had recent rx by urgent care for pharyngitis with amoxicillin. Will rx with augmentin.

## 2013-12-03 ENCOUNTER — Telehealth: Payer: Self-pay | Admitting: Family

## 2013-12-03 MED ORDER — FLUCONAZOLE 150 MG PO TABS: ORAL_TABLET | ORAL | Status: DC

## 2013-12-03 NOTE — Telephone Encounter (Signed)
Rx sent for diflucan

## 2013-12-03 NOTE — Telephone Encounter (Signed)
Notified pt. 

## 2013-12-03 NOTE — Telephone Encounter (Signed)
Patient states that the antibiotics that she has been on has given her a yeast infection and would like to know if Efraim Kaufmann would call her in something to [facility] on [address]

## 2013-12-05 ENCOUNTER — Encounter (INDEPENDENT_AMBULATORY_CARE_PROVIDER_SITE_OTHER): Payer: Self-pay

## 2013-12-10 ENCOUNTER — Telehealth (INDEPENDENT_AMBULATORY_CARE_PROVIDER_SITE_OTHER): Payer: Self-pay | Admitting: General Surgery

## 2013-12-10 ENCOUNTER — Ambulatory Visit (INDEPENDENT_AMBULATORY_CARE_PROVIDER_SITE_OTHER): Payer: BC Managed Care – PPO | Admitting: General Surgery

## 2013-12-10 ENCOUNTER — Encounter (INDEPENDENT_AMBULATORY_CARE_PROVIDER_SITE_OTHER): Payer: Self-pay | Admitting: General Surgery

## 2013-12-10 DIAGNOSIS — L7211 Pilar cyst: Secondary | ICD-10-CM

## 2013-12-10 NOTE — Patient Instructions (Signed)
Your scalp wounds are healing without any sign of severe infection. They may have developed a very minor infection which caused the skin edges to separate, but I expect that they will heal without any further surgery.  Take a shower once or twice a day and put a little bit of antibiotic ointment on each of the 2 areas.  Return to see Dr. Derrell Lolling in 3 weeks.

## 2013-12-10 NOTE — Telephone Encounter (Signed)
Pt called and voiced concern that there is drainage (watery and crusty) with redness and tenderness.  She wants it checked by MD as there is also a black spot on it.  When she tweezes it put tension on it, it bleeds.

## 2013-12-10 NOTE — Progress Notes (Signed)
Patient ID: [name], female   DOB: Jul 16, 1976, 37 y.o.   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 Biagio Quint on 10/10/2013. The wounds healed and then the 2 frontal wounds popped open. The nylon sutures have been removed from the skin on November 7. They are concerned because they can see some dark material present  Exam: Pleasant woman. Husband is present Left parietal incision has healed normally. 2 frontal scalp incisions are slightly open. The base of one of them that looks like a small fragment of Vicryl suture, pigmented, and I removed part of this but could not get all of it out. There was no signs of cellulitis or purulent.  Plan: Shower and scrub these areas with soap and water twice a day. Paint  with antibiotic ointment after each shower, very conservatively Return to see me in 3 weeks to be sure that we are all  satisfied this is healing properly.   Angelia Mould. Derrell Lolling, M.D., [facility] General and Minimally invasive Surgery Breast and Colorectal Surgery Office:   [phone] Pager:   [phone]

## 2013-12-24 ENCOUNTER — Encounter (INDEPENDENT_AMBULATORY_CARE_PROVIDER_SITE_OTHER): Payer: BC Managed Care – PPO | Admitting: General Surgery

## 2014-01-08 ENCOUNTER — Encounter (INDEPENDENT_AMBULATORY_CARE_PROVIDER_SITE_OTHER): Payer: BC Managed Care – PPO | Admitting: General Surgery

## 2014-02-19 ENCOUNTER — Encounter: Payer: Self-pay | Admitting: Family Medicine

## 2014-02-19 ENCOUNTER — Ambulatory Visit (INDEPENDENT_AMBULATORY_CARE_PROVIDER_SITE_OTHER): Payer: BC Managed Care – PPO | Admitting: Family Medicine

## 2014-02-19 VITALS — BP 120/84 | HR 87 | Temp 98.3°F | Ht 65.0 in | Wt 156.0 lb

## 2014-02-19 DIAGNOSIS — Z3169 Encounter for other general counseling and advice on procreation: Secondary | ICD-10-CM

## 2014-02-19 DIAGNOSIS — I10 Essential (primary) hypertension: Secondary | ICD-10-CM

## 2014-02-19 DIAGNOSIS — D179 Benign lipomatous neoplasm, unspecified: Secondary | ICD-10-CM

## 2014-02-19 MED ORDER — PRENATAL VITAMINS 0.8 MG PO TABS: 1.0000 | ORAL_TABLET | Freq: Every day | ORAL | Status: DC

## 2014-02-19 MED ORDER — FOLIC ACID 1 MG PO TABS: 1.0000 mg | ORAL_TABLET | Freq: Every day | ORAL | Status: DC

## 2014-02-19 NOTE — Patient Instructions (Signed)
[place] or Icy Hot patches  Vascular tumor Lipoma A lipoma is a noncancerous (benign) tumor composed of fat cells. They are usually found under the skin (subcutaneous). A lipoma may occur in any tissue of the body that contains fat. Common areas for lipomas to appear include the back, shoulders, buttocks, and thighs. Lipomas are a very common soft tissue growth. They are soft and grow slowly. Most problems caused by a lipoma depend on where it is growing. DIAGNOSIS  A lipoma can be diagnosed with a physical exam. These tumors rarely become cancerous, but radiographic studies can help determine this for certain. Studies used may include:  Computerized X-ray scans (CT or CAT scan).  Computerized magnetic scans (MRI). TREATMENT  Small lipomas that are not causing problems may be watched. If a lipoma continues to enlarge or causes problems, removal is often the best treatment. Lipomas can also be removed to improve appearance. Surgery is done to remove the fatty cells and the surrounding capsule. Most often, this is done with medicine that numbs the area (local anesthetic). The removed tissue is examined under a microscope to make sure it is not cancerous. Keep all follow-up appointments with your caregiver. SEEK MEDICAL CARE IF:   The lipoma becomes larger or hard.  The lipoma becomes painful, red, or increasingly swollen. These could be signs of infection or a more serious condition. Document Released: 11/19/2002 Document Revised: 02/21/2012 Document Reviewed: 05/01/2010 [facility] Patient Information 2014 [place], [place].    Sciatica Sciatica is pain, weakness, numbness, or tingling along the path of the sciatic nerve. The nerve starts in the lower back and runs down the back of each leg. The nerve controls the muscles in the lower leg and in the back of the knee, while also providing sensation to the back of the thigh, lower leg, and the sole of your foot. Sciatica is a symptom of another  medical condition. For instance, nerve damage or certain conditions, such as a herniated disk or bone spur on the spine, pinch or put pressure on the sciatic nerve. This causes the pain, weakness, or other sensations normally associated with sciatica. Generally, sciatica only affects one side of the body. CAUSES   Herniated or slipped disc.  Degenerative disk disease.  A pain disorder involving the narrow muscle in the buttocks (piriformis syndrome).  Pelvic injury or fracture.  Pregnancy.  Tumor (rare). SYMPTOMS  Symptoms can vary from mild to very severe. The symptoms usually travel from the low back to the buttocks and down the back of the leg. Symptoms can include:  Mild tingling or dull aches in the lower back, leg, or hip.  Numbness in the back of the calf or sole of the foot.  Burning sensations in the lower back, leg, or hip.  Sharp pains in the lower back, leg, or hip.  Leg weakness.  Severe back pain inhibiting movement. These symptoms may get worse with coughing, sneezing, laughing, or prolonged sitting or standing. Also, being overweight may worsen symptoms. DIAGNOSIS  Your caregiver will perform a physical exam to look for common symptoms of sciatica. He or she may ask you to do certain movements or activities that would trigger sciatic nerve pain. Other tests may be performed to find the cause of the sciatica. These may include:  Blood tests.  X-rays.  Imaging tests, such as an MRI or CT scan. TREATMENT  Treatment is directed at the cause of the sciatic pain. Sometimes, treatment is not necessary and the pain and  discomfort goes away on its own. If treatment is needed, your caregiver may suggest:  Over-the-counter medicines to relieve pain.  Prescription medicines, such as anti-inflammatory medicine, muscle relaxants, or narcotics.  Applying heat or ice to the painful area.  Steroid injections to lessen pain, irritation, and inflammation around the  nerve.  Reducing activity during periods of pain.  Exercising and stretching to strengthen your abdomen and improve flexibility of your spine. Your caregiver may suggest losing weight if the extra weight makes the back pain worse.  Physical therapy.  Surgery to eliminate what is pressing or pinching the nerve, such as a bone spur or part of a herniated disk. HOME CARE INSTRUCTIONS   Only take over-the-counter or prescription medicines for pain or discomfort as directed by your caregiver.  Apply ice to the affected area for 20 minutes, 3 4 times a day for the first 48 72 hours. Then try heat in the same way.  Exercise, stretch, or perform your usual activities if these do not aggravate your pain.  Attend physical therapy sessions as directed by your caregiver.  Keep all follow-up appointments as directed by your caregiver.  Do not wear high heels or shoes that do not provide proper support.  Check your mattress to see if it is too soft. A firm mattress may lessen your pain and discomfort. SEEK IMMEDIATE MEDICAL CARE IF:   You lose control of your bowel or bladder (incontinence).  You have increasing weakness in the lower back, pelvis, buttocks, or legs.  You have redness or swelling of your back.  You have a burning sensation when you urinate.  You have pain that gets worse when you lie down or awakens you at night.  Your pain is worse than you have experienced in the past.  Your pain is lasting longer than 4 weeks.  You are suddenly losing weight without reason. MAKE SURE YOU:  Understand these instructions.  Will watch your condition.  Will get help right away if you are not doing well or get worse. Document Released: 11/23/2001 Document Revised: 05/30/2012 Document Reviewed: 04/09/2012 [facility] Patient Information 2014 [place].

## 2014-02-19 NOTE — Progress Notes (Signed)
Pre visit review using our clinic review tool, if applicable. No additional management support is needed unless otherwise documented below in the visit note. 

## 2014-02-24 ENCOUNTER — Encounter: Payer: Self-pay | Admitting: Family Medicine

## 2014-02-24 DIAGNOSIS — Z3169 Encounter for other general counseling and advice on procreation: Secondary | ICD-10-CM | POA: Insufficient documentation

## 2014-02-24 DIAGNOSIS — D171 Benign lipomatous neoplasm of skin and subcutaneous tissue of trunk: Secondary | ICD-10-CM | POA: Insufficient documentation

## 2014-02-24 NOTE — Progress Notes (Signed)
Patient ID: [name], female   DOB: [birth year], 38 y.o.   MRN: [id number] [name] [id number] 08/01/1976 02/24/2014      Progress Note-Follow Up  Subjective  Chief Complaint  Chief Complaint  Patient presents with  . Mass    pt reported a moving lump on the lower back for few months. the lump increases in size along with the pain (on and off) when touching.     HPI  Patient is a 38 year old female in today for evaluation of lesion on left posterior hip. Initially she thought the lesion was a lipoma since she has had one removed in past but now she is concerned because it is growing and becoming more tender. Is associated with some increased paresthesias radiating down left posterior thigh with position changes. The lesion has become tender to touch and is mobile and growing. Otherwise she feels well. Denies CP/palp/SOB/HA/congestion/fevers/GI or GU c/o. Taking meds as prescribed  Past Medical History  Diagnosis Date  . Anemia   . Blood in stool   . Chicken pox   . Diverticulitis   . Hypertension   . Colon polyps   . Obesity   . Headache(784.0)     migraines  . Fractured toe 09/2013    left big toe about 3 weeks ago  . Seasonal allergies     Stuffy nose    Past Surgical History  Procedure Laterality Date  . Cesarean section  2006  . Laparoscopic cholecystectomy  2012  . Laparoscopic gastric sleeve resection [name] 06/19/2013    Procedure: LARAPROSCOPIC SLEEVE GASTRECTOMY WITH EGD;  Surgeon: Madilyn Hook, DO;  Location: [facility];  Service: General;  Laterality: [name];  LARAPROSCOPIC SLEEVE GASTRECTOMY WITH EGD   . Esophagogastroduodenoscopy [name] 06/19/2013    Procedure: ESOPHAGOGASTRODUODENOSCOPY (EGD);  Surgeon: Madilyn Hook, DO;  Location: [facility];  Service: General;  Laterality: [name];  . Ear cyst excision [name] 10/10/2013    Procedure:  REMOVAL Scalp masses;  Surgeon: Madilyn Hook, DO;  Location: [facility];  Service: General;  Laterality: [name];    Family History  Problem  Relation Age of Onset  . Stroke      family hx  . Crohn's disease Father   . Lupus Father   . Ulcerative colitis Father   . Cancer Other     breast    History   Social History  . Marital Status: Married    Spouse Name: [name]    Number of Children: [name]  . Years of Education: [name]   Occupational History  . Not on file.   Social History Main Topics  . Smoking status: Former Smoker -- 0.25 packs/day for 2 years    Types: Cigarettes    Quit date: 06/11/1996  . Smokeless tobacco: Never Used  . Alcohol Use: No  . Drug Use: No  . Sexual Activity: Not on file   Other Topics Concern  . Not on file   Social History Narrative  . No narrative on file    Current Outpatient Prescriptions on File Prior to Visit  Medication Sig Dispense Refill  . Calcium 500 MG CHEW Chew 500 mg by mouth 3 (three) times daily.       . cetirizine (ZYRTEC) 10 MG tablet Take 10 mg by mouth daily.      . Cyanocobalamin (VITAMIN B-12) 1000 MCG SUBL Place 1,000 mcg under the tongue every morning.       . fluticasone (FLONASE) 50 MCG/ACT nasal spray Place 2 sprays into  both nostrils daily.  16 g  3  . Multiple Vitamins-Minerals (MULTIVITAL) CHEW Chew 1 each by mouth 2 (two) times daily.       No current facility-administered medications on file prior to visit.    No Known Allergies  Review of Systems  Review of Systems  Constitutional: Negative for fever and malaise/fatigue.  HENT: Negative for congestion.   Eyes: Negative for discharge.  Respiratory: Negative for shortness of breath.   Cardiovascular: Negative for chest pain, palpitations and leg swelling.  Gastrointestinal: Negative for nausea, abdominal pain and diarrhea.  Genitourinary: Negative for dysuria.  Musculoskeletal: Positive for joint pain. Negative for falls.       Posterior left hip pain with pain radiating down posterior left thigh, stops at knee,   Skin: Negative for rash.  Neurological: Negative for loss of consciousness and  headaches.  Endo/Heme/Allergies: Negative for polydipsia.  Psychiatric/Behavioral: Negative for depression and suicidal ideas. The patient is not nervous/anxious and does not have insomnia.     Objective  BP 120/84  Pulse 87  Temp(Src) 98.3 F (36.8 C) (Oral)  Ht 5\' 5"  (1.651 m)  Wt 156 lb 0.6 oz (70.779 kg)  BMI 25.97 kg/m2  SpO2 98%  Physical Exam  Physical Exam  Constitutional: She is oriented to person, place, and time and well-developed, well-nourished, and in no distress. No distress.  HENT:  Head: Normocephalic and atraumatic.  Eyes: Conjunctivae are normal.  Neck: Neck supple. No thyromegaly present.  Cardiovascular: Normal rate, regular rhythm and normal heart sounds.   No murmur heard. Pulmonary/Chest: Effort normal and breath sounds normal. She has no wheezes.  Abdominal: She exhibits no distension and no mass.  Musculoskeletal: She exhibits no edema.  Lymphadenopathy:    She has no cervical adenopathy.  Neurological: She is alert and oriented to person, place, and time.  Skin: Skin is warm and dry. No rash noted. She is not diaphoretic.  Psychiatric: Memory, affect and judgment normal.    Lab Results  Component Value Date   TSH 1.725 02/23/2013   Lab Results  Component Value Date   WBC 6.3 10/04/2013   HGB 13.4 10/04/2013   HCT 41.4 10/04/2013   MCV 78.9 10/04/2013   PLT 304 10/04/2013   Lab Results  Component Value Date   CREATININE 0.65 10/04/2013   BUN 12 10/04/2013   NA 136 10/04/2013   K 4.0 10/04/2013   CL 101 10/04/2013   CO2 24 10/04/2013   Lab Results  Component Value Date   ALT 51* 06/20/2013   AST 29 06/20/2013   ALKPHOS 70 06/20/2013   BILITOT 0.5 06/20/2013   Lab Results  Component Value Date   CHOL 181 02/23/2013   Lab Results  Component Value Date   HDL 29* 02/23/2013   Lab Results  Component Value Date   LDLCALC Comment:   Not calculated due to Triglyceride >400. Suggest ordering Direct LDL (Unit Code: [phone]).   Total  Cholesterol/HDL Ratio:CHD Risk                        Coronary Heart Disease Risk Table                                        Men       Women          1/2 Average Risk  3.4        3.3              Average Risk              5.0        4.4           2X Average Risk              9.6        7.1           3X Average Risk             23.4       11.0 Use the calculated Patient Ratio above and the CHD Risk table  to determine the patient's CHD Risk. 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 Will refer to general surgery for consideration due to worsening symptoms and before next pregnancy. For now try moist heat and topical treatments  Hypertension Well controlled, no changes to meds

## 2014-02-24 NOTE — Assessment & Plan Note (Signed)
Encouraged ongoing daily prenatal vitamin and to minimize medications without consulting a physician

## 2014-02-24 NOTE — Assessment & Plan Note (Signed)
Well controlled, no changes to meds 

## 2014-02-24 NOTE — Assessment & Plan Note (Signed)
Over left hip and symptomatic with radicular symptoms down left leg. Will refer to general surgery for consideration due to worsening symptoms and before next pregnancy. For now try moist heat and topical treatments

## 2014-02-25 ENCOUNTER — Encounter (INDEPENDENT_AMBULATORY_CARE_PROVIDER_SITE_OTHER): Payer: Self-pay | Admitting: Surgery

## 2014-02-25 ENCOUNTER — Ambulatory Visit (INDEPENDENT_AMBULATORY_CARE_PROVIDER_SITE_OTHER): Payer: BC Managed Care – PPO | Admitting: Surgery

## 2014-02-25 ENCOUNTER — Encounter (HOSPITAL_COMMUNITY): Payer: Self-pay | Admitting: *Deleted

## 2014-02-25 VITALS — BP 124/80 | HR 80 | Temp 98.8°F | Resp 16 | Ht 65.0 in | Wt 155.0 lb

## 2014-02-25 DIAGNOSIS — R222 Localized swelling, mass and lump, trunk: Secondary | ICD-10-CM

## 2014-02-25 DIAGNOSIS — R229 Localized swelling, mass and lump, unspecified: Secondary | ICD-10-CM

## 2014-02-25 NOTE — Progress Notes (Signed)
Patient states had allergy when lipoma removed from scalp from something that was put on head at time of surgery.  Allergy was a type of rash that consisted of drainage from areas on scalp that patient reports but dermatologist unsure of what caused.

## 2014-02-25 NOTE — Patient Instructions (Signed)
Please consider the recommendations that we have given you today:  Consider surgery to remove the painful subcutaneous nodules on her lower back/hips.  This may not solve all of her pain concerns, but it is reasonable to proceed since it is causing discomfort.  Consider evaluation by a back surgeon if you have persistent bleeding and radiating pain to your thigh/leg (sciatica)  See the Handout(s) we have given you.  Please call our office at [phone] if you wish to schedule surgery or if you have further questions / concerns.   Back Pain, Adult Low back pain is very common. About 1 in 5 people have back pain.The cause of low back pain is rarely dangerous. The pain often gets better over time.About half of people with a sudden onset of back pain feel better in just 2 weeks. About 8 in 10 people feel better by 6 weeks.  CAUSES Some common causes of back pain include:  Strain of the muscles or ligaments supporting the spine.  Wear and tear (degeneration) of the spinal discs.  Arthritis.  Direct injury to the back. DIAGNOSIS Most of the time, the direct cause of low back pain is not known.However, back pain can be treated effectively even when the exact cause of the pain is unknown.Answering your caregiver's questions about your overall health and symptoms is one of the most accurate ways to make sure the cause of your pain is not dangerous. If your caregiver needs more information, he or she may order lab work or imaging tests (X-rays or MRIs).However, even if imaging tests show changes in your back, this usually does not require surgery. HOME CARE INSTRUCTIONS For many people, back pain returns.Since low back pain is rarely dangerous, it is often a condition that people can learn to [facility] their own.   Remain active. It is stressful on the back to sit or stand in one place. Do not sit, drive, or stand in one place for more than 30 minutes at a time. Take short walks on level  surfaces as soon as pain allows.Try to increase the length of time you walk each day.  Do not stay in bed.Resting more than 1 or 2 days can delay your recovery.  Do not avoid exercise or work.Your body is made to move.It is not dangerous to be active, even though your back may hurt.Your back will likely heal faster if you return to being active before your pain is gone.  Pay attention to your body when you bend and lift. Many people have less discomfortwhen lifting if they bend their knees, keep the load close to their bodies,and avoid twisting. Often, the most comfortable positions are those that put less stress on your recovering back.  Find a comfortable position to sleep. Use a firm mattress and lie on your side with your knees slightly bent. If you lie on your back, put a pillow under your knees.  Only take over-the-counter or prescription medicines as directed by your caregiver. Over-the-counter medicines to reduce pain and inflammation are often the most helpful.Your caregiver may prescribe muscle relaxant drugs.These medicines help dull your pain so you can more quickly return to your normal activities and healthy exercise.  Put ice on the injured area.  Put ice in a plastic bag.  Place a towel between your skin and the bag.  Leave the ice on for 15-20 minutes, 03-04 times a day for the first 2 to 3 days. After that, ice and heat may be alternated to  reduce pain and spasms.  Ask your caregiver about trying back exercises and gentle massage. This may be of some benefit.  Avoid feeling anxious or stressed.Stress increases muscle tension and can worsen back pain.It is important to recognize when you are anxious or stressed and learn ways to manage it.Exercise is a great option. SEEK MEDICAL CARE IF:  You have pain that is not relieved with rest or medicine.  You have pain that does not improve in 1 week.  You have new symptoms.  You are generally not feeling  well. SEEK IMMEDIATE MEDICAL CARE IF:   You have pain that radiates from your back into your legs.  You develop new bowel or bladder control problems.  You have unusual weakness or numbness in your arms or legs.  You develop nausea or vomiting.  You develop abdominal pain.  You feel faint. Document Released: 11/29/2005 Document Revised: 05/30/2012 Document Reviewed: 04/19/2011 [facility] Patient Information 2014 [place], [place].   Sciatica Sciatica is pain, weakness, numbness, or tingling along the path of the sciatic nerve. The nerve starts in the lower back and runs down the back of each leg. The nerve controls the muscles in the lower leg and in the back of the knee, while also providing sensation to the back of the thigh, lower leg, and the sole of your foot. Sciatica is a symptom of another medical condition. For instance, nerve damage or certain conditions, such as a herniated disk or bone spur on the spine, pinch or put pressure on the sciatic nerve. This causes the pain, weakness, or other sensations normally associated with sciatica. Generally, sciatica only affects one side of the body. CAUSES   Herniated or slipped disc.  Degenerative disk disease.  A pain disorder involving the narrow muscle in the buttocks (piriformis syndrome).  Pelvic injury or fracture.  Pregnancy.  Tumor (rare). SYMPTOMS  Symptoms can vary from mild to very severe. The symptoms usually travel from the low back to the buttocks and down the back of the leg. Symptoms can include:  Mild tingling or dull aches in the lower back, leg, or hip.  Numbness in the back of the calf or sole of the foot.  Burning sensations in the lower back, leg, or hip.  Sharp pains in the lower back, leg, or hip.  Leg weakness.  Severe back pain inhibiting movement. These symptoms may get worse with coughing, sneezing, laughing, or prolonged sitting or standing. Also, being overweight may worsen symptoms. DIAGNOSIS   Your caregiver will perform a physical exam to look for common symptoms of sciatica. He or she may ask you to do certain movements or activities that would trigger sciatic nerve pain. Other tests may be performed to find the cause of the sciatica. These may include:  Blood tests.  X-rays.  Imaging tests, such as an MRI or CT scan. TREATMENT  Treatment is directed at the cause of the sciatic pain. Sometimes, treatment is not necessary and the pain and discomfort goes away on its own. If treatment is needed, your caregiver may suggest:  Over-the-counter medicines to relieve pain.  Prescription medicines, such as anti-inflammatory medicine, muscle relaxants, or narcotics.  Applying heat or ice to the painful area.  Steroid injections to lessen pain, irritation, and inflammation around the nerve.  Reducing activity during periods of pain.  Exercising and stretching to strengthen your abdomen and improve flexibility of your spine. Your caregiver may suggest losing weight if the extra weight makes the back pain worse.  Physical therapy.  Surgery to eliminate what is pressing or pinching the nerve, such as a bone spur or part of a herniated disk. HOME CARE INSTRUCTIONS   Only take over-the-counter or prescription medicines for pain or discomfort as directed by your caregiver.  Apply ice to the affected area for 20 minutes, 3 4 times a day for the first 48 72 hours. Then try heat in the same way.  Exercise, stretch, or perform your usual activities if these do not aggravate your pain.  Attend physical therapy sessions as directed by your caregiver.  Keep all follow-up appointments as directed by your caregiver.  Do not wear high heels or shoes that do not provide proper support.  Check your mattress to see if it is too soft. A firm mattress may lessen your pain and discomfort. SEEK IMMEDIATE MEDICAL CARE IF:   You lose control of your bowel or bladder (incontinence).  You have  increasing weakness in the lower back, pelvis, buttocks, or legs.  You have redness or swelling of your back.  You have a burning sensation when you urinate.  You have pain that gets worse when you lie down or awakens you at night.  Your pain is worse than you have experienced in the past.  Your pain is lasting longer than 4 weeks.  You are suddenly losing weight without reason. MAKE SURE YOU:  Understand these instructions.  Will watch your condition.  Will get help right away if you are not doing well or get worse. Document Released: 11/23/2001 Document Revised: 05/30/2012 Document Reviewed: 04/09/2012 [facility] Patient Information 2014 [place].   Lipoma A lipoma is a noncancerous (benign) tumor composed of fat cells. They are usually found under the skin (subcutaneous). A lipoma may occur in any tissue of the body that contains fat. Common areas for lipomas to appear include the back, shoulders, buttocks, and thighs. Lipomas are a very common soft tissue growth. They are soft and grow slowly. Most problems caused by a lipoma depend on where it is growing. DIAGNOSIS  A lipoma can be diagnosed with a physical exam. These tumors rarely become cancerous, but radiographic studies can help determine this for certain. Studies used may include:  Computerized X-ray scans (CT or CAT scan).  Computerized magnetic scans (MRI). TREATMENT  Small lipomas that are not causing problems may be watched. If a lipoma continues to enlarge or causes problems, removal is often the best treatment. Lipomas can also be removed to improve appearance. Surgery is done to remove the fatty cells and the surrounding capsule. Most often, this is done with medicine that numbs the area (local anesthetic). The removed tissue is examined under a microscope to make sure it is not cancerous. Keep all follow-up appointments with your caregiver. SEEK MEDICAL CARE IF:   The lipoma becomes larger or hard.  The  lipoma becomes painful, red, or increasingly swollen. These could be signs of infection or a more serious condition. Document Released: 11/19/2002 Document Revised: 02/21/2012 Document Reviewed: 05/01/2010 [facility] Patient Information 2014 [place], [place].  GENERAL SURGERY: POST OP INSTRUCTIONS  1. DIET: Follow a light bland diet the first 24 hours after arrival home, such as soup, liquids, crackers, etc.  Be sure to include lots of fluids daily.  Avoid fast food or heavy meals as your are more likely to get nauseated.   2. Take your usually prescribed home medications unless otherwise directed. 3. PAIN CONTROL: a. Pain is best controlled by a usual combination of three different  methods TOGETHER: i. Ice/Heat ii. Over the counter pain medication iii. Prescription pain medication b. Most patients will experience some swelling and bruising around the incisions.  Ice packs or heating pads (30-60 minutes up to 6 times a day) will help. Use ice for the first few days to help decrease swelling and bruising, then switch to heat to help relax tight/sore spots and speed recovery.  Some people prefer to use ice alone, heat alone, alternating between ice & heat.  Experiment to what works for you.  Swelling and bruising can take several weeks to resolve.   c. It is helpful to take an over-the-counter pain medication regularly for the first few weeks.  Choose one of the following that works best for you: i. Naproxen (Aleve, etc)  Two 220mg  tabs twice a day ii. Ibuprofen (Advil, etc) Three 200mg  tabs four times a day (every meal & bedtime) iii. Acetaminophen (Tylenol, etc) 500-650mg  four times a day (every meal & bedtime) d. A  prescription for pain medication (such as oxycodone, hydrocodone, etc) should be given to you upon discharge.  Take your pain medication as prescribed.  i. If you are having problems/concerns with the prescription medicine (does not control pain, nausea, vomiting, rash, itching, etc),  please call us [phone] to see if we need to switch you to a different pain medicine that will work better for you and/or control your side effect better. ii. If you need a refill on your pain medication, please contact your pharmacy.  They will contact our office to request authorization. Prescriptions will not be filled after 5 pm or on week-ends. 4. Avoid getting constipated.  Between the surgery and the pain medications, it is common to experience some constipation.  Increasing fluid intake and taking a fiber supplement (such as Metamucil, Citrucel, FiberCon, MiraLax, etc) 1-2 times a day regularly will usually help prevent this problem from occurring.  A mild laxative (prune juice, Milk of Magnesia, MiraLax, etc) should be taken according to package directions if there are no bowel movements after 48 hours.   5. Wash / shower every day.  You may shower over the dressings as they are waterproof.  Continue to shower over incision(s) after the dressing is off. 6. Remove your waterproof bandages 5 days after surgery.  You may leave the incision open to air.  You may have skin tapes (Steri Strips) covering the incision(s).  Leave them on until one week, then remove.  You may replace a dressing/Band-Aid to cover the incision for comfort if you wish.      7. ACTIVITIES as tolerated:   a. You may resume regular (light) daily activities beginning the next day-such as daily self-care, walking, climbing stairs-gradually increasing activities as tolerated.  If you can walk 30 minutes without difficulty, it is safe to try more intense activity such as jogging, treadmill, bicycling, low-impact aerobics, swimming, etc. b. Save the most intensive and strenuous activity for last such as sit-ups, heavy lifting, contact sports, etc  Refrain from any heavy lifting or straining until you are off narcotics for pain control.   c. DO NOT PUSH THROUGH PAIN.  Let pain be your guide: If it hurts to do something, don't  do it.  Pain is your body warning you to avoid that activity for another week until the pain goes down. d. You may drive when you are no longer taking prescription pain medication, you can comfortably wear a seatbelt, and you can safely maneuver your car and apply  brakes. e. Dennis Bast may have sexual intercourse when it is comfortable.  8. FOLLOW UP in our office a. Please call [facility] at [phone] to set up an appointment to see your surgeon in the office for a follow-up appointment approximately 2-3 weeks after your surgery. b. Make sure that you call for this appointment the day you arrive home to insure a convenient appointment time. 9. IF YOU HAVE DISABILITY OR FAMILY LEAVE FORMS, BRING THEM TO THE OFFICE FOR PROCESSING.  DO NOT GIVE THEM TO YOUR DOCTOR.   WHEN TO CALL us [phone]: 1. Poor pain control 2. Reactions / problems with new medications (rash/itching, nausea, etc)  3. Fever over 101.5 F (38.5 C) 4. Worsening swelling or bruising 5. Continued bleeding from incision. 6. Increased pain, redness, or drainage from the incision 7. Difficulty breathing / swallowing   The clinic staff is available to answer your questions during regular business hours (8:30am-5pm).  Please don't hesitate to call and ask to speak to one of our nurses for clinical concerns.   If you have a medical emergency, go to the nearest emergency room or call 911.  A surgeon from [facility] is always on call at the [facility], [address] [place], [place], [place], [place]  [address] ? MAIN: [phone] ? TOLL FREE: [phone] ?  FAX [phone] [email or web address]

## 2014-02-25 NOTE — Progress Notes (Signed)
Subjective:     Patient ID: [name], female   DOB: [birth year], 38 y.o.   MRN: [id number]  HPI  Note: This dictation was prepared with Dragon/digital dictation along with [facility] technology. Any transcriptional errors that result from this process are unintentional.       [name]  04/03/1976 [id number]  Patient Care Team: Debbrah Alar, NP as PCP - General (Internal Medicine) Bethann Goo Himmelrich, RD as Dietitian [facility])  This patient is a 38 y.o.female who presents today for surgical evaluation at the request of Dr. Willette Alma.   Reason for visit: Painful nodules on lower back.  Possible sciatica  Pleasant woman with numerous prior surgeries.  Gastric sleeve July 2014 last year with good weight loss.  Resolution of health issues.  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  Both by Dr. Lilyan Punt who has returned back into the [place].  Eventually healed.  More recently, she has noted some lower back discomfort and feels a nodule.  Her husband was worried about that as well.  She noted she gets a lot of discomfort in the left back hip when she tries to sit.  She has had a few episodes where she feels pain radiating down to her left thigh.  Wondering if that is sciatica.  She has a history of back pain in the past.  Sounds like mainly thoracic.  Had electric therapy and massage therapy.  That improved.  She has never had lower lumbar back pain where these lesions are.  No history of skin infections.  She did have an instance of some type of rash near her scalp masses removed last fall.  Question of an allergic reaction according to her dermatologist.  Improve rapidly.  Because of persistent discomfort and a mass underneath the skin, surgical consultation recommended to consider if removing these masses would be of benefit  Patient Active Problem List   Diagnosis Date Noted  . Lipoma 02/24/2014  . Encounter for preconception consultation  02/24/2014  . 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 Sinusitis 11/12/2013  . Anxiety state, unspecified 05/22/2013  . Allergic rhinitis 02/07/2012  . Dyslipidemia 07/28/2011  . Hypertension 01/22/2011    Past Medical History  Diagnosis Date  . Anemia   . Blood in stool   . Chicken pox   . Diverticulitis   . Hypertension   . Colon polyps   . Obesity   . Headache(784.0)     migraines  . Fractured toe 09/2013    left big toe about 3 weeks ago  . Seasonal allergies     Stuffy nose    Past Surgical History  Procedure Laterality Date  . Cesarean section  2006  . Laparoscopic cholecystectomy  2012  . Laparoscopic gastric sleeve resection [name] 06/19/2013    Procedure: LARAPROSCOPIC SLEEVE GASTRECTOMY WITH EGD;  Surgeon: Madilyn Hook, DO;  Location: [facility];  Service: General;  Laterality: [name];  LARAPROSCOPIC SLEEVE GASTRECTOMY WITH EGD   . Esophagogastroduodenoscopy [name] 06/19/2013    Procedure: ESOPHAGOGASTRODUODENOSCOPY (EGD);  Surgeon: Madilyn Hook, DO;  Location: [facility];  Service: General;  Laterality: [name];  . Ear cyst excision [name] 10/10/2013    Procedure:  REMOVAL Scalp masses;  Surgeon: Madilyn Hook, DO;  Location: [facility];  Service: General;  Laterality: [name];    History   Social History  . Marital Status: Married    Spouse Name: [name]    Number of Children: [name]  . Years of Education: [name]   Occupational History  .  Not on file.   Social History Main Topics  . Smoking status: Former Smoker -- 0.25 packs/day for 2 years    Types: Cigarettes    Quit date: 06/11/1996  . Smokeless tobacco: Never Used  . Alcohol Use: No  . Drug Use: No  . Sexual Activity: Not on file   Other Topics Concern  . Not on file   Social History Narrative  . No narrative on file    Family History  Problem Relation Age of Onset  . Stroke      family hx  . Crohn's disease Father   . Lupus Father   . Ulcerative colitis Father   . Cancer Other     breast    Current Outpatient Prescriptions    Medication Sig Dispense Refill  . Calcium 500 MG CHEW Chew 500 mg by mouth 3 (three) times daily.       . cetirizine (ZYRTEC) 10 MG tablet Take 10 mg by mouth daily.      . Cyanocobalamin (VITAMIN B-12) 1000 MCG SUBL Place 1,000 mcg under the tongue every morning.       . fluconazole (DIFLUCAN) 150 MG tablet       . fluticasone (FLONASE) 50 MCG/ACT nasal spray Place 2 sprays into both nostrils daily.  16 g  3  . folic acid (FOLVITE) 1 MG tablet Take 1 tablet (1 mg total) by mouth daily.      . Multiple Vitamins-Minerals (MULTIVITAL) CHEW Chew 1 each by mouth 2 (two) times daily.      . Prenatal Multivit-Min-Fe-FA (PRENATAL VITAMINS) 0.8 MG tablet Take 1 tablet by mouth daily.       No current facility-administered medications for this visit.     No Known Allergies  BP 124/80  Pulse 80  Temp(Src) 98.8 F (37.1 C) (Oral)  Resp 16  Ht 5\' 5"  (1.651 m)  Wt 155 lb (70.308 kg)  BMI 25.79 kg/m2  No results found.   Review of Systems  Constitutional: Negative for fever, chills, diaphoresis, appetite change and fatigue.  HENT: Negative for ear discharge, ear pain, sore throat and trouble swallowing.   Eyes: Negative for photophobia, discharge and visual disturbance.  Respiratory: Negative for cough, choking, chest tightness and shortness of breath.   Cardiovascular: Negative for chest pain and palpitations.  Gastrointestinal: Negative for nausea, vomiting, abdominal pain, diarrhea, constipation, anal bleeding and rectal pain.  Endocrine: Negative for cold intolerance and heat intolerance.  Genitourinary: Negative for dysuria, frequency and difficulty urinating.  Musculoskeletal: Positive for back pain. Negative for arthralgias, gait problem, joint swelling, myalgias and neck pain.  Skin: Negative for color change, pallor and rash.  Allergic/Immunologic: Negative for environmental allergies, food allergies and immunocompromised state.  Neurological: Negative for dizziness, speech  difficulty, weakness and numbness.  Hematological: Negative for adenopathy.  Psychiatric/Behavioral: Negative for confusion and agitation. The patient is not nervous/anxious.        Objective:   Physical Exam  Constitutional: She is oriented to person, place, and time. She appears well-developed and well-nourished. No distress.  HENT:  Head: Normocephalic.    Mouth/Throat: Oropharynx is clear and moist. No oropharyngeal exudate.  Eyes: Conjunctivae and EOM are normal. Pupils are equal, round, and reactive to light. No scleral icterus.  Neck: Normal range of motion. Neck supple. No tracheal deviation present.  Cardiovascular: Normal rate, regular rhythm and intact distal pulses.   Pulmonary/Chest: Effort normal and breath sounds normal. No stridor. No respiratory distress. She exhibits no tenderness.  Abdominal: Soft. She exhibits no distension and no mass. There is no tenderness. Hernia confirmed negative in the right inguinal area and confirmed negative in the left inguinal area.  Genitourinary: No vaginal discharge found.  Musculoskeletal: Normal range of motion. She exhibits no tenderness.       Right elbow: She exhibits normal range of motion.       Left elbow: She exhibits normal range of motion.       Right wrist: She exhibits normal range of motion.       Left wrist: She exhibits normal range of motion.       Back:       Right hand: Normal strength noted.       Left hand: Normal strength noted.  Lymphadenopathy:       Head (right side): No posterior auricular adenopathy present.       Head (left side): No posterior auricular adenopathy present.    She has no cervical adenopathy.    She has no axillary adenopathy.       Right: No inguinal adenopathy present.       Left: No inguinal adenopathy present.  Neurological: She is alert and oriented to person, place, and time. No cranial nerve deficit. She exhibits normal muscle tone. Coordination normal.  Skin: Skin is warm and  dry. No rash noted. She is not diaphoretic. No erythema.  Psychiatric: She has a normal mood and affect. Her behavior is normal. Judgment and thought content normal.       Assessment:     Tender subcutaneous posterior hip/paramedian lower back masses.  Most likely calcifications vs. Atypical lipomas     Plan:     Because there distinct masses superficial to the deep musculature, I think it is reasonable to perform a local excision.  She is alert he tried anti-inflammatories and heat for over a month without improvement, so I think nonmedical options have been exhausted.I think the areas were small enough that a drain will not be likely.  We will plan to get aggressive field block as well.  I did caution her that this may not solve all of her back and posterior leg issues.  We did discuss possibility of having a back surgical consultation.  She was comfortable with removing masses first & then regrouping.  The pathophysiology of skin & subcutaneous masses was discussed.  Natural history risks without surgery were discussed.  I recommended surgery to remove the mass.  I explained the technique of removal with use of local anesthesia &need for more aggressive sedation/anesthesia for patient comfort.    Risks such as bleeding, infection, heart attack, death, and other risks were discussed.  I noted a good likelihood this will help address the problem.   Possibility that this will not correct all symptoms was explained. Possibility she could still have lower back pain and nerve issues explained.  Probability this would not solve radicular pain 2 left posterior thigh discussed as well.  Possibility of regrowth/recurrence of the mass was discussed.  We will work to minimize complications. Questions were answered.  The patient expresses understanding & wishes to proceed with surgery.

## 2014-02-28 ENCOUNTER — Ambulatory Visit (HOSPITAL_COMMUNITY): Payer: BC Managed Care – PPO | Admitting: Certified Registered"

## 2014-02-28 ENCOUNTER — Encounter (HOSPITAL_COMMUNITY): Payer: BC Managed Care – PPO | Admitting: Certified Registered"

## 2014-02-28 ENCOUNTER — Encounter (HOSPITAL_COMMUNITY)
Admission: RE | Disposition: A | Discharge status: Home / Self Care | Payer: Self-pay | Source: Ambulatory Visit | Attending: Surgery

## 2014-02-28 ENCOUNTER — Ambulatory Visit (HOSPITAL_COMMUNITY)
Admission: RE | Admit: 2014-02-28 | Discharge: 2014-02-28 | Disposition: A | Discharge status: Home / Self Care | Payer: BC Managed Care – PPO | Source: Ambulatory Visit | Attending: Surgery | Admitting: Surgery

## 2014-02-28 ENCOUNTER — Encounter (HOSPITAL_COMMUNITY): Payer: Self-pay | Admitting: *Deleted

## 2014-02-28 DIAGNOSIS — R222 Localized swelling, mass and lump, trunk: Secondary | ICD-10-CM

## 2014-02-28 DIAGNOSIS — D1739 Benign lipomatous neoplasm of skin and subcutaneous tissue of other sites: Secondary | ICD-10-CM

## 2014-02-28 DIAGNOSIS — Z87891 Personal history of nicotine dependence: Secondary | ICD-10-CM | POA: Insufficient documentation

## 2014-02-28 DIAGNOSIS — Z9089 Acquired absence of other organs: Secondary | ICD-10-CM | POA: Insufficient documentation

## 2014-02-28 DIAGNOSIS — R229 Localized swelling, mass and lump, unspecified: Secondary | ICD-10-CM | POA: Insufficient documentation

## 2014-02-28 DIAGNOSIS — I10 Essential (primary) hypertension: Secondary | ICD-10-CM | POA: Insufficient documentation

## 2014-02-28 DIAGNOSIS — E785 Hyperlipidemia, unspecified: Secondary | ICD-10-CM | POA: Insufficient documentation

## 2014-02-28 DIAGNOSIS — Z79899 Other long term (current) drug therapy: Secondary | ICD-10-CM | POA: Insufficient documentation

## 2014-02-28 HISTORY — PX: MASS EXCISION: SHX2000

## 2014-02-28 LAB — CBC
HCT: 38 % (ref 36.0–46.0)
HEMOGLOBIN: 12.7 g/dL (ref 12.0–15.0)
MCH: 26.6 pg (ref 26.0–34.0)
MCHC: 33.4 g/dL (ref 30.0–36.0)
MCV: 79.7 fL (ref 78.0–100.0)
PLATELETS: 267 10*3/uL (ref 150–400)
RBC: 4.77 MIL/uL (ref 3.87–5.11)
RDW: 14.1 % (ref 11.5–15.5)
WBC: 5.8 10*3/uL (ref 4.0–10.5)

## 2014-02-28 LAB — HCG, SERUM, QUALITATIVE: PREG SERUM: NEGATIVE

## 2014-02-28 SURGERY — EXCISION MASS
Anesthesia: General | Site: Back | Laterality: Bilateral

## 2014-02-28 MED ORDER — FENTANYL CITRATE 0.05 MG/ML IJ SOLN
25.0000 ug | INTRAMUSCULAR | Status: DC | PRN
Start: 2014-02-28 — End: 2014-02-28
  Administered 2014-02-28: 25 ug via INTRAVENOUS

## 2014-02-28 MED ORDER — DEXAMETHASONE SODIUM PHOSPHATE 10 MG/ML IJ SOLN
INTRAMUSCULAR | Status: DC | PRN
  Administered 2014-02-28: 10 mg via INTRAVENOUS

## 2014-02-28 MED ORDER — 0.9 % SODIUM CHLORIDE (POUR BTL) OPTIME
TOPICAL | Status: DC | PRN
  Administered 2014-02-28: 1000 mL

## 2014-02-28 MED ORDER — PROPOFOL 10 MG/ML IV BOLUS
INTRAVENOUS | Status: DC | PRN
  Administered 2014-02-28: 180 mg via INTRAVENOUS

## 2014-02-28 MED ORDER — CHLORHEXIDINE GLUCONATE 4 % EX LIQD: 1.0000 "application " | Freq: Once | CUTANEOUS | Status: DC

## 2014-02-28 MED ORDER — LACTATED RINGERS IV SOLN
INTRAVENOUS | Status: DC | PRN
  Administered 2014-02-28: 13:00:00 via INTRAVENOUS

## 2014-02-28 MED ORDER — PROPOFOL 10 MG/ML IV BOLUS
INTRAVENOUS | Status: AC
  Filled 2014-02-28: qty 20

## 2014-02-28 MED ORDER — FENTANYL CITRATE 0.05 MG/ML IJ SOLN: 25.0000 ug | INTRAMUSCULAR | Status: DC | PRN

## 2014-02-28 MED ORDER — FENTANYL CITRATE 0.05 MG/ML IJ SOLN
INTRAMUSCULAR | Status: AC
  Filled 2014-02-28: qty 2

## 2014-02-28 MED ORDER — ONDANSETRON HCL 4 MG/2ML IJ SOLN
INTRAMUSCULAR | Status: AC
  Filled 2014-02-28: qty 2

## 2014-02-28 MED ORDER — FENTANYL CITRATE 0.05 MG/ML IJ SOLN
INTRAMUSCULAR | Status: AC
  Filled 2014-02-28: qty 5

## 2014-02-28 MED ORDER — LIDOCAINE HCL (CARDIAC) 20 MG/ML IV SOLN
INTRAVENOUS | Status: DC | PRN
  Administered 2014-02-28: 50 mg via INTRAVENOUS

## 2014-02-28 MED ORDER — CEFAZOLIN SODIUM-DEXTROSE 2-3 GM-% IV SOLR
INTRAVENOUS | Status: AC
  Filled 2014-02-28: qty 50

## 2014-02-28 MED ORDER — OXYCODONE HCL 5 MG PO TABS: 5.0000 mg | ORAL_TABLET | ORAL | Status: DC | PRN

## 2014-02-28 MED ORDER — ACETAMINOPHEN 650 MG RE SUPP
650.0000 mg | RECTAL | Status: DC | PRN
  Filled 2014-02-28: qty 1

## 2014-02-28 MED ORDER — ACETAMINOPHEN 325 MG PO TABS: 650.0000 mg | ORAL_TABLET | ORAL | Status: DC | PRN

## 2014-02-28 MED ORDER — FENTANYL CITRATE 0.05 MG/ML IJ SOLN
INTRAMUSCULAR | Status: DC | PRN
  Administered 2014-02-28 (×2): 50 ug via INTRAVENOUS
  Administered 2014-02-28: 100 ug via INTRAVENOUS
  Administered 2014-02-28: 50 ug via INTRAVENOUS

## 2014-02-28 MED ORDER — CEFAZOLIN SODIUM-DEXTROSE 2-3 GM-% IV SOLR
2.0000 g | INTRAVENOUS | Status: AC
  Administered 2014-02-28: 2 g via INTRAVENOUS

## 2014-02-28 MED ORDER — SODIUM CHLORIDE 0.9 % IJ SOLN: 3.0000 mL | Freq: Two times a day (BID) | INTRAMUSCULAR | Status: DC

## 2014-02-28 MED ORDER — SODIUM CHLORIDE 0.9 % IJ SOLN: 3.0000 mL | INTRAMUSCULAR | Status: DC | PRN

## 2014-02-28 MED ORDER — MIDAZOLAM HCL 5 MG/5ML IJ SOLN
INTRAMUSCULAR | Status: DC | PRN
  Administered 2014-02-28: 2 mg via INTRAVENOUS

## 2014-02-28 MED ORDER — LACTATED RINGERS IV SOLN
INTRAVENOUS | Status: DC
  Administered 2014-02-28: 1000 mL via INTRAVENOUS

## 2014-02-28 MED ORDER — LACTATED RINGERS IV SOLN: INTRAVENOUS | Status: DC

## 2014-02-28 MED ORDER — SODIUM CHLORIDE 0.9 % IV SOLN: 250.0000 mL | INTRAVENOUS | Status: DC | PRN

## 2014-02-28 MED ORDER — KETOROLAC TROMETHAMINE 30 MG/ML IJ SOLN
INTRAMUSCULAR | Status: DC | PRN
  Administered 2014-02-28: 30 mg via INTRAVENOUS

## 2014-02-28 MED ORDER — DEXAMETHASONE SODIUM PHOSPHATE 10 MG/ML IJ SOLN
INTRAMUSCULAR | Status: AC
  Filled 2014-02-28: qty 1

## 2014-02-28 MED ORDER — LIDOCAINE HCL (CARDIAC) 20 MG/ML IV SOLN
INTRAVENOUS | Status: AC
  Filled 2014-02-28: qty 5

## 2014-02-28 MED ORDER — BUPIVACAINE-EPINEPHRINE PF 0.25-1:200000 % IJ SOLN
INTRAMUSCULAR | Status: AC
  Filled 2014-02-28: qty 30

## 2014-02-28 MED ORDER — KETOROLAC TROMETHAMINE 30 MG/ML IJ SOLN
INTRAMUSCULAR | Status: AC
  Filled 2014-02-28: qty 1

## 2014-02-28 MED ORDER — MIDAZOLAM HCL 2 MG/2ML IJ SOLN
INTRAMUSCULAR | Status: AC
  Filled 2014-02-28: qty 2

## 2014-02-28 MED ORDER — ACETAMINOPHEN 500 MG PO TABS
1000.0000 mg | ORAL_TABLET | Freq: Three times a day (TID) | ORAL | Status: DC
  Filled 2014-02-28 (×3): qty 2

## 2014-02-28 MED ORDER — BUPIVACAINE-EPINEPHRINE 0.25% -1:200000 IJ SOLN
INTRAMUSCULAR | Status: DC | PRN
  Administered 2014-02-28: 60 mL

## 2014-02-28 MED ORDER — IBUPROFEN 200 MG PO TABS: 800.0000 mg | ORAL_TABLET | Freq: Four times a day (QID) | ORAL | Status: DC | PRN

## 2014-02-28 MED ORDER — ONDANSETRON HCL 4 MG/2ML IJ SOLN
INTRAMUSCULAR | Status: DC | PRN
  Administered 2014-02-28: 4 mg via INTRAVENOUS

## 2014-02-28 SURGICAL SUPPLY — 33 items
BLADE HEX COATED 2.75 (ELECTRODE) ×2 IMPLANT
BLADE SURG 15 STRL LF DISP TIS (BLADE) ×1 IMPLANT
BLADE SURG 15 STRL SS (BLADE) ×1
BRIEF STRETCH FOR OB PAD LRG (UNDERPADS AND DIAPERS) ×2 IMPLANT
CLOSURE STERI-STRIP 1/4X4 (GAUZE/BANDAGES/DRESSINGS) ×2 IMPLANT
DRAPE LAPAROTOMY T 102X78X121 (DRAPES) ×2 IMPLANT
DRSG PAD ABDOMINAL 8X10 ST (GAUZE/BANDAGES/DRESSINGS) ×2 IMPLANT
DRSG TEGADERM 4X4.75 (GAUZE/BANDAGES/DRESSINGS) ×2 IMPLANT
ELECT REM PT RETURN 9FT ADLT (ELECTROSURGICAL) ×2
ELECTRODE REM PT RTRN 9FT ADLT (ELECTROSURGICAL) ×1 IMPLANT
GAUZE SPONGE 2X2 8PLY STRL LF (GAUZE/BANDAGES/DRESSINGS) ×1 IMPLANT
GAUZE SPONGE 4X4 16PLY XRAY LF (GAUZE/BANDAGES/DRESSINGS) IMPLANT
GLOVE ECLIPSE 8.0 STRL XLNG CF (GLOVE) ×2 IMPLANT
GLOVE INDICATOR 8.0 STRL GRN (GLOVE) ×2 IMPLANT
GLOVE SURG SS PI 7.0 STRL IVOR (GLOVE) ×2 IMPLANT
GOWN STRL REUS W/TWL XL LVL3 (GOWN DISPOSABLE) ×4 IMPLANT
KIT BASIN OR (CUSTOM PROCEDURE TRAY) ×2 IMPLANT
LUBRICANT JELLY K Y 4OZ (MISCELLANEOUS) IMPLANT
NEEDLE HYPO 22GX1.5 SAFETY (NEEDLE) ×2 IMPLANT
PACK BASIC VI WITH GOWN DISP (CUSTOM PROCEDURE TRAY) ×2 IMPLANT
PENCIL BUTTON HOLSTER BLD 10FT (ELECTRODE) ×2 IMPLANT
SPONGE GAUZE 2X2 STER 10/PKG (GAUZE/BANDAGES/DRESSINGS) ×1
SPONGE GAUZE 4X4 12PLY (GAUZE/BANDAGES/DRESSINGS) ×2 IMPLANT
SPONGE LAP 18X18 X RAY DECT (DISPOSABLE) IMPLANT
SUT MNCRL AB 4-0 PS2 18 (SUTURE) ×2 IMPLANT
SUT VIC AB 3-0 SH 18 (SUTURE) ×2 IMPLANT
SUT VIC AB 3-0 SH 27 (SUTURE) ×1
SUT VIC AB 3-0 SH 27X BRD (SUTURE) ×1 IMPLANT
SYR 20CC LL (SYRINGE) ×2 IMPLANT
SYR BULB IRRIGATION 50ML (SYRINGE) ×2 IMPLANT
TOWEL OR 17X26 10 PK STRL BLUE (TOWEL DISPOSABLE) ×2 IMPLANT
TOWEL OR NON WOVEN STRL DISP B (DISPOSABLE) ×2 IMPLANT
YANKAUER SUCT BULB TIP 10FT TU (MISCELLANEOUS) ×2 IMPLANT

## 2014-02-28 NOTE — H&P (View-Only) (Signed)
Subjective:     Patient ID: [name], female   DOB: November 17, 1976, 38 y.o.   MRN: [id number]  HPI  Note: This dictation was prepared with Dragon/digital dictation along with [facility] technology. Any transcriptional errors that result from this process are unintentional.       [name]  [birth year] [id number]  Patient Care Team: Debbrah Alar, NP as PCP - General (Internal Medicine) Bethann Goo Himmelrich, RD as Dietitian [facility])  This patient is a 38 y.o.female who presents today for surgical evaluation at the request of Dr. Willette Alma.   Reason for visit: Painful nodules on lower back.  Possible sciatica  Pleasant woman with numerous prior surgeries.  Gastric sleeve July 2014 last year with good weight loss.  Resolution of health issues.  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  Both by Dr. Lilyan Punt who has returned back into the [place].  Eventually healed.  More recently, she has noted some lower back discomfort and feels a nodule.  Her husband was worried about that as well.  She noted she gets a lot of discomfort in the left back hip when she tries to sit.  She has had a few episodes where she feels pain radiating down to her left thigh.  Wondering if that is sciatica.  She has a history of back pain in the past.  Sounds like mainly thoracic.  Had electric therapy and massage therapy.  That improved.  She has never had lower lumbar back pain where these lesions are.  No history of skin infections.  She did have an instance of some type of rash near her scalp masses removed last fall.  Question of an allergic reaction according to her dermatologist.  Improve rapidly.  Because of persistent discomfort and a mass underneath the skin, surgical consultation recommended to consider if removing these masses would be of benefit  Patient Active Problem List   Diagnosis Date Noted  . Lipoma 02/24/2014  . Encounter for preconception consultation  02/24/2014  . 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 Sinusitis 11/12/2013  . Anxiety state, unspecified 05/22/2013  . Allergic rhinitis 02/07/2012  . Dyslipidemia 07/28/2011  . Hypertension 01/22/2011    Past Medical History  Diagnosis Date  . Anemia   . Blood in stool   . Chicken pox   . Diverticulitis   . Hypertension   . Colon polyps   . Obesity   . Headache(784.0)     migraines  . Fractured toe 09/2013    left big toe about 3 weeks ago  . Seasonal allergies     Stuffy nose    Past Surgical History  Procedure Laterality Date  . Cesarean section  2006  . Laparoscopic cholecystectomy  2012  . Laparoscopic gastric sleeve resection [name] 06/19/2013    Procedure: LARAPROSCOPIC SLEEVE GASTRECTOMY WITH EGD;  Surgeon: Madilyn Hook, DO;  Location: [facility];  Service: General;  Laterality: [name];  LARAPROSCOPIC SLEEVE GASTRECTOMY WITH EGD   . Esophagogastroduodenoscopy [name] 06/19/2013    Procedure: ESOPHAGOGASTRODUODENOSCOPY (EGD);  Surgeon: Madilyn Hook, DO;  Location: [facility];  Service: General;  Laterality: [name];  . Ear cyst excision [name] 10/10/2013    Procedure:  REMOVAL Scalp masses;  Surgeon: Madilyn Hook, DO;  Location: [facility];  Service: General;  Laterality: [name];    History   Social History  . Marital Status: Married    Spouse Name: [name]    Number of Children: [name]  . Years of Education: [name]   Occupational History  .  Not on file.   Social History Main Topics  . Smoking status: Former Smoker -- 0.25 packs/day for 2 years    Types: Cigarettes    Quit date: 06/11/1996  . Smokeless tobacco: Never Used  . Alcohol Use: No  . Drug Use: No  . Sexual Activity: Not on file   Other Topics Concern  . Not on file   Social History Narrative  . No narrative on file    Family History  Problem Relation Age of Onset  . Stroke      family hx  . Crohn's disease Father   . Lupus Father   . Ulcerative colitis Father   . Cancer Other     breast    Current Outpatient Prescriptions    Medication Sig Dispense Refill  . Calcium 500 MG CHEW Chew 500 mg by mouth 3 (three) times daily.       . cetirizine (ZYRTEC) 10 MG tablet Take 10 mg by mouth daily.      . Cyanocobalamin (VITAMIN B-12) 1000 MCG SUBL Place 1,000 mcg under the tongue every morning.       . fluconazole (DIFLUCAN) 150 MG tablet       . fluticasone (FLONASE) 50 MCG/ACT nasal spray Place 2 sprays into both nostrils daily.  16 g  3  . folic acid (FOLVITE) 1 MG tablet Take 1 tablet (1 mg total) by mouth daily.      . Multiple Vitamins-Minerals (MULTIVITAL) CHEW Chew 1 each by mouth 2 (two) times daily.      . Prenatal Multivit-Min-Fe-FA (PRENATAL VITAMINS) 0.8 MG tablet Take 1 tablet by mouth daily.       No current facility-administered medications for this visit.     No Known Allergies  BP 124/80  Pulse 80  Temp(Src) 98.8 F (37.1 C) (Oral)  Resp 16  Ht 5\' 5"  (1.651 m)  Wt 155 lb (70.308 kg)  BMI 25.79 kg/m2  No results found.   Review of Systems  Constitutional: Negative for fever, chills, diaphoresis, appetite change and fatigue.  HENT: Negative for ear discharge, ear pain, sore throat and trouble swallowing.   Eyes: Negative for photophobia, discharge and visual disturbance.  Respiratory: Negative for cough, choking, chest tightness and shortness of breath.   Cardiovascular: Negative for chest pain and palpitations.  Gastrointestinal: Negative for nausea, vomiting, abdominal pain, diarrhea, constipation, anal bleeding and rectal pain.  Endocrine: Negative for cold intolerance and heat intolerance.  Genitourinary: Negative for dysuria, frequency and difficulty urinating.  Musculoskeletal: Positive for back pain. Negative for arthralgias, gait problem, joint swelling, myalgias and neck pain.  Skin: Negative for color change, pallor and rash.  Allergic/Immunologic: Negative for environmental allergies, food allergies and immunocompromised state.  Neurological: Negative for dizziness, speech  difficulty, weakness and numbness.  Hematological: Negative for adenopathy.  Psychiatric/Behavioral: Negative for confusion and agitation. The patient is not nervous/anxious.        Objective:   Physical Exam  Constitutional: She is oriented to person, place, and time. She appears well-developed and well-nourished. No distress.  HENT:  Head: Normocephalic.    Mouth/Throat: Oropharynx is clear and moist. No oropharyngeal exudate.  Eyes: Conjunctivae and EOM are normal. Pupils are equal, round, and reactive to light. No scleral icterus.  Neck: Normal range of motion. Neck supple. No tracheal deviation present.  Cardiovascular: Normal rate, regular rhythm and intact distal pulses.   Pulmonary/Chest: Effort normal and breath sounds normal. No stridor. No respiratory distress. She exhibits no tenderness.  Abdominal: Soft. She exhibits no distension and no mass. There is no tenderness. Hernia confirmed negative in the right inguinal area and confirmed negative in the left inguinal area.  Genitourinary: No vaginal discharge found.  Musculoskeletal: Normal range of motion. She exhibits no tenderness.       Right elbow: She exhibits normal range of motion.       Left elbow: She exhibits normal range of motion.       Right wrist: She exhibits normal range of motion.       Left wrist: She exhibits normal range of motion.       Back:       Right hand: Normal strength noted.       Left hand: Normal strength noted.  Lymphadenopathy:       Head (right side): No posterior auricular adenopathy present.       Head (left side): No posterior auricular adenopathy present.    She has no cervical adenopathy.    She has no axillary adenopathy.       Right: No inguinal adenopathy present.       Left: No inguinal adenopathy present.  Neurological: She is alert and oriented to person, place, and time. No cranial nerve deficit. She exhibits normal muscle tone. Coordination normal.  Skin: Skin is warm and  dry. No rash noted. She is not diaphoretic. No erythema.  Psychiatric: She has a normal mood and affect. Her behavior is normal. Judgment and thought content normal.       Assessment:     Tender subcutaneous posterior hip/paramedian lower back masses.  Most likely calcifications vs. Atypical lipomas     Plan:     Because there distinct masses superficial to the deep musculature, I think it is reasonable to perform a local excision.  She is alert he tried anti-inflammatories and heat for over a month without improvement, so I think nonmedical options have been exhausted.I think the areas were small enough that a drain will not be likely.  We will plan to get aggressive field block as well.  I did caution her that this may not solve all of her back and posterior leg issues.  We did discuss possibility of having a back surgical consultation.  She was comfortable with removing masses first & then regrouping.  The pathophysiology of skin & subcutaneous masses was discussed.  Natural history risks without surgery were discussed.  I recommended surgery to remove the mass.  I explained the technique of removal with use of local anesthesia &need for more aggressive sedation/anesthesia for patient comfort.    Risks such as bleeding, infection, heart attack, death, and other risks were discussed.  I noted a good likelihood this will help address the problem.   Possibility that this will not correct all symptoms was explained. Possibility she could still have lower back pain and nerve issues explained.  Probability this would not solve radicular pain 2 left posterior thigh discussed as well.  Possibility of regrowth/recurrence of the mass was discussed.  We will work to minimize complications. Questions were answered.  The patient expresses understanding & wishes to proceed with surgery.

## 2014-02-28 NOTE — Anesthesia Postprocedure Evaluation (Signed)
  Anesthesia Post-op Note  Patient: [name]  Procedure(s) Performed: Procedure(s) (LRB): EXCISION OF BILATERAL DEEP SUBCUTANEOUS BACK MASSES (Bilateral)  Patient Location: PACU  Anesthesia Type: General  Level of Consciousness: awake and alert   Airway and Oxygen Therapy: Patient Spontanous Breathing  Post-op Pain: mild  Post-op Assessment: Post-op Vital signs reviewed, Patient's Cardiovascular Status Stable, Respiratory Function Stable, Patent Airway and No signs of Nausea or vomiting  Last Vitals:  Filed Vitals:   02/28/14 1545  BP: 121/69  Pulse: 83  Temp: 36.2 C  Resp: 20    Post-op Vital Signs: stable   Complications: No apparent anesthesia complications

## 2014-02-28 NOTE — Op Note (Signed)
02/28/2014  3:05 PM  PATIENT:  [name]  38 y.o. female  Patient Care Team: Debbrah Alar, NP as PCP - General (Internal Medicine) Bethann Goo Himmelrich, RD as Dietitian (Bariatrics) Mosie Lukes, MD as Consulting Physician (Family Medicine)  PRE-OPERATIVE DIAGNOSIS:  lower back painful subcutaneous masses  POST-OPERATIVE DIAGNOSIS:  lower back painful subcutaneous masses  PROCEDURE:  Procedure(s): EXCISION OF BILATERAL DEEP SUBCUTANEOUS BACK MASSES  SURGEON:  Surgeon(s): Adin Hector, MD  ASSISTANT: RN   ANESTHESIA:   local, IV sedation, MAC and LMA  EBL:     Delay start of Pharmacological VTE agent (>24hrs) due to surgical blood loss or risk of bleeding:  no  DRAINS: none   SPECIMEN:  Source of Specimen:  Deep SQ masses overlying posterior iliac crest (5x4x2cm fibrofatty ellipsoid masses)  DISPOSITION OF SPECIMEN:  PATHOLOGY  COUNTS:  YES  PLAN OF CARE: Discharge to home after PACU  PATIENT DISPOSITION:  PACU - hemodynamically stable.  INDICATION:   Pleasant patient with painful nodular areas rolling over her posterior hips.  Left far worse than right.  No improvement with anti-inflammatories.  She is concerned it may have gotten larger.  Sent to me for evaluation.  I offered surgical resection.  The pathophysiology of skin & subcutaneous masses was discussed.  Natural history risks without surgery were discussed.  I recommended surgery to remove the mass.  I explained the technique of removal with use of local anesthesia & possible need for more aggressive sedation/anesthesia for patient comfort.    Risks such as bleeding, infection, heart attack, death, and other risks were discussed.  I noted a good likelihood this will help address the problem.   Possibility that this will not correct all symptoms was explained. Possibility of regrowth/recurrence of the mass was discussed.  We will work to minimize complications. Questions were answered.  The  patient expresses understanding & wishes to proceed with surgery.  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  Within them,, there were some firmer nodules suspicious for the areas of irritation.  No invasion into the bone muscle or deeper tissues  DESCRIPTION:   Informed consent was confirmed.  Patient underwent deep sedation with Ultramar laryngeal mask airway without difficulty.  She Susann right-side-down decubitus fashion.  Her back was prepped and draped in sterile fashion.  I premarked the areas of concern.  Surgical timeout confirmed our plan.  I made a transverse incision in the paramedian lumbar posterior back region.  I made this over the nodular somewhat mobile masses on the posterior iliac crest.  He came to the subcutaneous tissue case tissues.  Encountered and globular cluster of fatty tissue as well as some firmer nodules within it.  I carefully free the surrounding lesions to the subcutaneous tissues and then off the posterior iliac crest as well.  It came out cleanly.  Pressure hemostasis.  Inspection of the mass confirming the nodular area of concern within the larger lipoma.  Rest of the wound seems soft and normal.  I did a similar resection on the right side.  Slightly larger fibro-lipomatous nodule within a cluster of fatty masses.  A piece placed a field block on the posterior iliac crest and subcutaneous tissues and skin.  Pressure hemostasis.  I closed the wounds using interrupted 3-0 Vicryl deep dermal stitches.  Used for Monocryl running subcuticular stitch.  Useful and Steri-Strips.  Sterile dressing applied.  Patient's extubated.  I discussed postoperative findings with the patient's husband & her mother.  Expected postoperative plan was discussed.  They are thinking about traveling out-of-town due to a sudden death and need to go to [place] for funeral.  She probably is okay in a few days if her pain is  well-controlled.  We will see.

## 2014-02-28 NOTE — Interval H&P Note (Signed)
History and Physical Interval Note:  02/28/2014 1:44 PM  [name]  has presented today for surgery, with the diagnosis of lower back subcutaneous masses  The various methods of treatment have been discussed with the patient and family. After consideration of risks, benefits and other options for treatment, the patient has consented to  Procedure(s): EXCISION BACK SUBCUTANEOUS MASS ([name]) as a surgical intervention .  The patient's history has been reviewed, patient examined, no change in status, stable for surgery.  I have reviewed the patient's chart and labs.  Questions were answered to the patient's satisfaction.     Mina Babula C.

## 2014-02-28 NOTE — Transfer of Care (Signed)
Immediate Anesthesia Transfer of Care Note  Patient: [name]  Procedure(s) Performed: Procedure(s): EXCISION OF BILATERAL DEEP SUBCUTANEOUS BACK MASSES (Bilateral)  Patient Location: PACU  Anesthesia Type:General  Level of Consciousness: awake, alert  and oriented  Airway & Oxygen Therapy: Patient Spontanous Breathing and Patient connected to face mask oxygen  Post-op Assessment: Report given to PACU RN and Post -op Vital signs reviewed and stable  Post vital signs: Reviewed and stable  Complications: No apparent anesthesia complications

## 2014-02-28 NOTE — Discharge Instructions (Signed)
GENERAL SURGERY: POST OP INSTRUCTIONS ° °1. DIET: Follow a light bland diet the first 24 hours after arrival home, such as soup, liquids, crackers, etc.  Be sure to include lots of fluids daily.  Avoid fast food or heavy meals as your are more likely to get nauseated.   °2. Take your usually prescribed home medications unless otherwise directed. °3. PAIN CONTROL: °a. Pain is best controlled by a usual combination of three different methods TOGETHER: °i. Ice/Heat °ii. Over the counter pain medication °iii. Prescription pain medication °b. Most patients will experience some swelling and bruising around the incisions.  Ice packs or heating pads (30-60 minutes up to 6 times a day) will help. Use ice for the first few days to help decrease swelling and bruising, then switch to heat to help relax tight/sore spots and speed recovery.  Some people prefer to use ice alone, heat alone, alternating between ice & heat.  Experiment to what works for you.  Swelling and bruising can take several weeks to resolve.   °c. It is helpful to take an over-the-counter pain medication regularly for the first few weeks.  Choose one of the following that works best for you: °i. Naproxen (Aleve, etc)  Two 220mg tabs twice a day °ii. Ibuprofen (Advil, etc) Three 200mg tabs four times a day (every meal & bedtime) °iii. Acetaminophen (Tylenol, etc) 500-650mg four times a day (every meal & bedtime) °d. A  prescription for pain medication (such as oxycodone, hydrocodone, etc) should be given to you upon discharge.  Take your pain medication as prescribed.  °i. If you are having problems/concerns with the prescription medicine (does not control pain, nausea, vomiting, rash, itching, etc), please call us [phone] to see if we need to switch you to a different pain medicine that will work better for you and/or control your side effect better. °ii. If you need a refill on your pain medication, please contact your pharmacy.  They will contact our  office to request authorization. Prescriptions will not be filled after 5 pm or on week-ends. °4. Avoid getting constipated.  Between the surgery and the pain medications, it is common to experience some constipation.  Increasing fluid intake and taking a fiber supplement (such as Metamucil, Citrucel, FiberCon, MiraLax, etc) 1-2 times a day regularly will usually help prevent this problem from occurring.  A mild laxative (prune juice, Milk of Magnesia, MiraLax, etc) should be taken according to package directions if there are no bowel movements after 48 hours.   °5. Wash / shower every day.  You may shower over the dressings as they are waterproof.  Continue to shower over incision(s) after the dressing is off. °6. Remove your waterproof bandages 5 days after surgery.  You may leave the incision open to air.  You may have skin tapes (Steri Strips) covering the incision(s).  Leave them on until one week, then remove.  You may replace a dressing/Band-Aid to cover the incision for comfort if you wish.  ° ° ° ° °7. ACTIVITIES as tolerated:   °a. You may resume regular (light) daily activities beginning the next day--such as daily self-care, walking, climbing stairs--gradually increasing activities as tolerated.  If you can walk 30 minutes without difficulty, it is safe to try more intense activity such as jogging, treadmill, bicycling, low-impact aerobics, swimming, etc. °b. Save the most intensive and strenuous activity for last such as sit-ups, heavy lifting, contact sports, etc  Refrain from any heavy lifting or straining until you   are off narcotics for pain control.   c. DO NOT PUSH THROUGH PAIN.  Let pain be your guide: If it hurts to do something, don't do it.  Pain is your body warning you to avoid that activity for another week until the pain goes down. d. You may drive when you are no longer taking prescription pain medication, you can comfortably wear a seatbelt, and you can safely maneuver your car and  apply brakes. e. Dennis Bast may have sexual intercourse when it is comfortable.  8. FOLLOW UP in our office a. Please call [facility] at [phone] to set up an appointment to see your surgeon in the office for a follow-up appointment approximately 2-3 weeks after your surgery. b. Make sure that you call for this appointment the day you arrive home to insure a convenient appointment time. 9. IF YOU HAVE DISABILITY OR FAMILY LEAVE FORMS, BRING THEM TO THE OFFICE FOR PROCESSING.  DO NOT GIVE THEM TO YOUR DOCTOR.   WHEN TO CALL us [phone]: 1. Poor pain control 2. Reactions / problems with new medications (rash/itching, nausea, etc)  3. Fever over 101.5 F (38.5 C) 4. Worsening swelling or bruising 5. Continued bleeding from incision. 6. Increased pain, redness, or drainage from the incision 7. Difficulty breathing / swallowing   The clinic staff is available to answer your questions during regular business hours (8:30am-5pm).  Please dont hesitate to call and ask to speak to one of our nurses for clinical concerns.   If you have a medical emergency, go to the nearest emergency room or call 911.  A surgeon from [facility] is always on call at the [facility], [address], [place], [place], [place]  [address] ? MAIN: [phone] ? TOLL FREE: [phone] ?  FAX [phone] [email or web address]  Managing Pain  Pain after surgery or related to activity is often due to strain/injury to muscle, tendon, nerves and/or incisions.  This pain is usually short-term and will improve in a few months.   Many people find it helpful to do the following things TOGETHER to help speed the process of healing and to get back to regular activity more quickly:  1. Avoid heavy physical activity a.  no lifting greater than 20 pounds b. Do not push through the pain.  Listen to your body and avoid positions and maneuvers than reproduce the  pain c. Walking is okay as tolerated, but go slowly and stop when getting sore.  d. Remember: If it hurts to do it, then dont do it! 2. Take Anti-inflammatory medication  a. Take with food/snack around the clock for 1-2 weeks i. This helps the muscle and nerve tissues become less irritable and calm down faster b. Choose ONE of the following over-the-counter medications: i. Naproxen 220mg  tabs (ex. Aleve) 1-2 pills twice a day  ii. Ibuprofen 200mg  tabs (ex. Advil, Motrin) 3-4 pills with every meal and just before bedtime iii. Acetaminophen 500mg  tabs (Tylenol) 1-2 pills with every meal and just before bedtime 3. Use a Heating pad or Ice/Cold Pack a. 4-6 times a day b. May use warm bath/hottub  or showers 4. Try Gentle Massage and/or Stretching  a. at the area of pain many times a day b. stop if you feel pain - do not overdo it  Try these steps together to help you body heal faster and avoid making things get worse.  Doing just one of these things may not be  enough.    If you are not getting better after two weeks or are noticing you are getting worse, contact our office for further advice; we may need to re-evaluate you & see what other things we can do to help.

## 2014-02-28 NOTE — Anesthesia Preprocedure Evaluation (Addendum)
Anesthesia Evaluation  Patient identified by MRN, date of birth, ID band Patient awake    Reviewed: Allergy & Precautions, H&P , NPO status , Patient's Chart, lab work & pertinent test results  Airway Mallampati: II TM Distance: >3 FB Neck ROM: full    Dental no notable dental hx. (+) Teeth Intact, Dental Advisory Given   Pulmonary neg pulmonary ROS, former smoker,  breath sounds clear to auscultation  Pulmonary exam normal       Cardiovascular Exercise Tolerance: Good hypertension, Rhythm:regular Rate:Normal     Neuro/Psych  Headaches, negative neurological ROS  negative psych ROS   GI/Hepatic negative GI ROS, Neg liver ROS,   Endo/Other  negative endocrine ROS  Renal/GU negative Renal ROS  negative genitourinary   Musculoskeletal   Abdominal   Peds  Hematology negative hematology ROS (+)   Anesthesia Other Findings   Reproductive/Obstetrics negative OB ROS                          Anesthesia Physical Anesthesia Plan  ASA: II  Anesthesia Plan: General   Post-op Pain Management:    Induction: Intravenous  Airway Management Planned: LMA  Additional Equipment:   Intra-op Plan:   Post-operative Plan:   Informed Consent: I have reviewed the patients History and Physical, chart, labs and discussed the procedure including the risks, benefits and alternatives for the proposed anesthesia with the patient or authorized representative who has indicated his/her understanding and acceptance.   Dental Advisory Given  Plan Discussed with: CRNA and Surgeon  Anesthesia Plan Comments:         Anesthesia Quick Evaluation

## 2014-03-04 ENCOUNTER — Encounter (HOSPITAL_COMMUNITY): Payer: Self-pay | Admitting: Surgery

## 2014-03-05 ENCOUNTER — Telehealth (INDEPENDENT_AMBULATORY_CARE_PROVIDER_SITE_OTHER): Payer: Self-pay

## 2014-03-05 NOTE — Telephone Encounter (Signed)
Called pt to notify her that the pathology report shows masses removed benign lipoma's per Dr Johney Maine. The pt is doing well. The pt has a f/u appt with Dr Johney Maine on 03/27/14.

## 2014-03-06 ENCOUNTER — Encounter (INDEPENDENT_AMBULATORY_CARE_PROVIDER_SITE_OTHER): Payer: BC Managed Care – PPO | Admitting: Surgery

## 2014-03-06 ENCOUNTER — Encounter (INDEPENDENT_AMBULATORY_CARE_PROVIDER_SITE_OTHER): Payer: BC Managed Care – PPO | Admitting: General Surgery

## 2014-03-27 ENCOUNTER — Encounter (INDEPENDENT_AMBULATORY_CARE_PROVIDER_SITE_OTHER): Payer: Self-pay | Admitting: Surgery

## 2014-03-27 ENCOUNTER — Ambulatory Visit (INDEPENDENT_AMBULATORY_CARE_PROVIDER_SITE_OTHER): Payer: BC Managed Care – PPO | Admitting: Surgery

## 2014-03-27 VITALS — BP 122/82 | HR 78 | Temp 97.6°F | Resp 14 | Ht 65.0 in | Wt 158.0 lb

## 2014-03-27 DIAGNOSIS — D1739 Benign lipomatous neoplasm of skin and subcutaneous tissue of other sites: Secondary | ICD-10-CM

## 2014-03-27 DIAGNOSIS — D171 Benign lipomatous neoplasm of skin and subcutaneous tissue of trunk: Secondary | ICD-10-CM

## 2014-03-27 DIAGNOSIS — M5430 Sciatica, unspecified side: Secondary | ICD-10-CM

## 2014-03-27 NOTE — Progress Notes (Signed)
Subjective:     Patient ID: [name], female   DOB: May 18, 1976, 38 y.o.   MRN: [id number]  HPI  Note: This dictation was prepared with Dragon/digital dictation along with [facility] technology. Any transcriptional errors that result from this process are unintentional.       [name]  15-Sep-1976 [id number]  Patient Care Team: Debbrah Alar, NP as PCP - General (Internal Medicine) Bethann Goo Himmelrich, RD as Dietitian (Bariatrics) Mosie Lukes, MD as Consulting Physician (Family Medicine)  Procedure (Date: 02/28/2014):  POST-OPERATIVE DIAGNOSIS: lower back painful subcutaneous masses   PROCEDURE: Procedure(s):  EXCISION OF BILATERAL DEEP SUBCUTANEOUS BACK MASSES  SURGEON: Surgeon(s):  Adin Hector, MD      This patient returns for surgical re-evaluation.  She feels well.  She no longer has any sciatic type pain going down the left posterior leg.  She has a little mild symptoms on the right but that is already improving.  Off narcotics.  In good spirits.  No fever chills or sweats.  She feels mild pulling at the incisions when she turns or twists  Patient Active Problem List   Diagnosis Date Noted  . Masses on back, Left inferior 2x2cm & right inferior 3x2cm 02/25/2014  . Lipoma 02/24/2014  . Encounter for preconception consultation 02/24/2014  . 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 Sinusitis 11/12/2013  . Anxiety state, unspecified 05/22/2013  . Allergic rhinitis 02/07/2012  . Dyslipidemia 07/28/2011  . Hypertension 01/22/2011    Past Medical History  Diagnosis Date  . Anemia   . Blood in stool   . Chicken pox   . Diverticulitis   . Colon polyps   . Obesity   . Headache(784.0)     migraines  . Fractured toe 09/2013    left big toe about 3 weeks ago  . Seasonal allergies     Stuffy nose  . Hypertension     no problems since 06/2013   . 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 Cesarean section  2006  .  Laparoscopic cholecystectomy  2012  . Laparoscopic gastric sleeve resection [name] 06/19/2013    Procedure: LARAPROSCOPIC SLEEVE GASTRECTOMY WITH EGD;  Surgeon: Madilyn Hook, DO;  Location: [facility];  Service: General;  Laterality: [name];  LARAPROSCOPIC SLEEVE GASTRECTOMY WITH EGD   . Esophagogastroduodenoscopy [name] 06/19/2013    Procedure: ESOPHAGOGASTRODUODENOSCOPY (EGD);  Surgeon: Madilyn Hook, DO;  Location: [facility];  Service: General;  Laterality: [name];  . Ear cyst excision [name] 10/10/2013    Procedure:  REMOVAL Scalp masses;  Surgeon: Madilyn Hook, DO;  Location: [facility];  Service: General;  Laterality: [name];  . Mass excision Bilateral 02/28/2014    Procedure: EXCISION OF BILATERAL DEEP SUBCUTANEOUS BACK MASSES;  Surgeon: Adin Hector, MD;  Location: [facility];  Service: General;  Laterality: Bilateral;    History   Social History  . Marital Status: Married    Spouse Name: [name]    Number of Children: [name]  . Years of Education: [name]   Occupational History  . Not on file.   Social History Main Topics  . Smoking status: Former Smoker -- 0.25 packs/day for 2 years    Types: Cigarettes    Quit date: 06/11/1996  . Smokeless tobacco: Never Used  . Alcohol Use: No  . Drug Use: No  . Sexual Activity: Not on file   Other Topics Concern  . Not on file   Social History Narrative  .  No narrative on file    Family History  Problem Relation Age of Onset  . Stroke      family hx  . Crohn's disease Father   . Lupus Father   . Ulcerative colitis Father   . Cancer Other     breast    Current Outpatient Prescriptions  Medication Sig Dispense Refill  . Calcium 500 MG CHEW Chew 500 mg by mouth 3 (three) times daily.       . cetirizine (ZYRTEC) 10 MG tablet Take 10 mg by mouth daily.      . Cyanocobalamin (VITAMIN B-12) 1000 MCG SUBL Take 1,000 mcg by mouth every morning.       . fluticasone (FLONASE) 50 MCG/ACT nasal spray Place 2 sprays into both nostrils daily.  16 g  3  . folic acid (FOLVITE)  935 MCG tablet Take 400 mcg by mouth daily.      Marland Kitchen ibuprofen (ADVIL,MOTRIN) 200 MG tablet Take 4 tablets (800 mg total) by mouth every 6 (six) hours as needed for fever, headache, mild pain, moderate pain or cramping.  30 tablet  0  . Multiple Vitamins-Minerals (MULTIVITAL) CHEW Chew 1 each by mouth 2 (two) times daily.      . Prenatal Multivit-Min-Fe-FA (PRENATAL VITAMINS) 0.8 MG tablet Take 1 tablet by mouth daily.       No current facility-administered medications for this visit.     No Known Allergies  BP 122/82  Pulse 78  Temp(Src) 97.6 F (36.4 C) (Temporal)  Resp 14  Ht 5\' 5"  (1.651 m)  Wt 158 lb (71.668 kg)  BMI 26.29 kg/m2  LMP 02/18/2014  No results found.   Review of Systems  Constitutional: Negative for fever, chills and diaphoresis.  HENT: Negative for ear pain, sore throat and trouble swallowing.   Eyes: Negative for photophobia and visual disturbance.  Respiratory: Negative for cough and choking.   Cardiovascular: Negative for chest pain and palpitations.  Gastrointestinal: Negative for nausea, vomiting, abdominal pain, diarrhea, constipation, anal bleeding and rectal pain.  Genitourinary: Negative for dysuria, frequency and difficulty urinating.  Musculoskeletal: Negative for gait problem and myalgias.  Skin: Negative for color change, pallor and rash.  Neurological: Negative for dizziness, speech difficulty, weakness and numbness.  Hematological: Negative for adenopathy.  Psychiatric/Behavioral: Negative for confusion and agitation. The patient is not nervous/anxious.        Objective:   Physical Exam  Constitutional: She is oriented to person, place, and time. She appears well-developed and well-nourished. No distress.  HENT:  Head: Normocephalic.  Mouth/Throat: Oropharynx is clear and moist. No oropharyngeal exudate.  Eyes: Conjunctivae and EOM are normal. Pupils are equal, round, and reactive to light. No scleral icterus.  Neck: Normal range of  motion. No tracheal deviation present.  Cardiovascular: Normal rate and intact distal pulses.   Pulmonary/Chest: Effort normal. No respiratory distress. She exhibits no tenderness.  Abdominal: Soft. She exhibits no distension. There is no tenderness. Hernia confirmed negative in the right inguinal area and confirmed negative in the left inguinal area.  Incisions clean with normal healing ridges.  No hernias  Genitourinary: No vaginal discharge found.  Musculoskeletal: Normal range of motion. She exhibits no tenderness.       Back:  Lymphadenopathy:       Right: No inguinal adenopathy present.       Left: No inguinal adenopathy present.  Neurological: She is alert and oriented to person, place, and time. No cranial nerve deficit. She exhibits normal muscle  tone. Coordination normal.  Skin: Skin is warm and dry. No rash noted. She is not diaphoretic.  Psychiatric: She has a normal mood and affect. Her behavior is normal.       Assessment:     Recovering well status post removal of lumbar subcutaneous back masses consistent with lipomas.  Referred nerve pain seems to be improving.     Plan:     I am glad that her preop radiating nerve pain is fading away.  I think her risk of infection is fading away.  Increase activity as tolerated to regular activity.  Low impact exercise such as walking an hour a day at least ideal.  Do not push through pain.  Diet as tolerated.  Low fat high fiber diet ideal.  Bowel regimen with 30 g fiber a day and fiber supplement as needed to avoid problems.  Return to clinic as needed.   Instructions discussed.  Followup with primary care physician for other health issues as would normally be done.  Consider screening for malignancies (breast, prostate, colon, melanoma, etc) as appropriate.  Questions answered.  The patient expressed understanding and appreciation

## 2014-03-27 NOTE — Patient Instructions (Signed)
GENERAL SURGERY: POST OP INSTRUCTIONS  1. DIET: Follow a light bland diet the first 24 hours after arrival home, such as soup, liquids, crackers, etc.  Be sure to include lots of fluids daily.  Avoid fast food or heavy meals as your are more likely to get nauseated.   2. Take your usually prescribed home medications unless otherwise directed. 3. PAIN CONTROL: a. Pain is best controlled by a usual combination of three different methods TOGETHER: i. Ice/Heat ii. Over the counter pain medication iii. Prescription pain medication b. Most patients will experience some swelling and bruising around the incisions.  Ice packs or heating pads (30-60 minutes up to 6 times a day) will help. Use ice for the first few days to help decrease swelling and bruising, then switch to heat to help relax tight/sore spots and speed recovery.  Some people prefer to use ice alone, heat alone, alternating between ice & heat.  Experiment to what works for you.  Swelling and bruising can take several weeks to resolve.   c. It is helpful to take an over-the-counter pain medication regularly for the first few weeks.  Choose one of the following that works best for you: i. Naproxen (Aleve, etc)  Two 274m tabs twice a day ii. Ibuprofen (Advil, etc) Three 2085mtabs four times a day (every meal & bedtime) iii. Acetaminophen (Tylenol, etc) 500-65021mour times a day (every meal & bedtime) d. A  prescription for pain medication (such as oxycodone, hydrocodone, etc) should be given to you upon discharge.  Take your pain medication as prescribed.  i. If you are having problems/concerns with the prescription medicine (does not control pain, nausea, vomiting, rash, itching, etc), please call us [phone] see if we need to switch you to a different pain medicine that will work better for you and/or control your side effect better. ii. If you need a refill on your pain medication, please contact your pharmacy.  They will contact our  office to request authorization. Prescriptions will not be filled after 5 pm or on week-ends. 4. Avoid getting constipated.  Between the surgery and the pain medications, it is common to experience some constipation.  Increasing fluid intake and taking a fiber supplement (such as Metamucil, Citrucel, FiberCon, MiraLax, etc) 1-2 times a day regularly will usually help prevent this problem from occurring.  A mild laxative (prune juice, Milk of Magnesia, MiraLax, etc) should be taken according to package directions if there are no bowel movements after 48 hours.   5. Wash / shower every day.  You may shower over the dressings as they are waterproof.  Continue to shower over incision(s) after the dressing is off. 6. Remove your waterproof bandages 5 days after surgery.  You may leave the incision open to air.  You may have skin tapes (Steri Strips) covering the incision(s).  Leave them on until one week, then remove.  You may replace a dressing/Band-Aid to cover the incision for comfort if you wish.      7. ACTIVITIES as tolerated:   a. You may resume regular (light) daily activities beginning the next day-such as daily self-care, walking, climbing stairs-gradually increasing activities as tolerated.  If you can walk 30 minutes without difficulty, it is safe to try more intense activity such as jogging, treadmill, bicycling, low-impact aerobics, swimming, etc. b. Save the most intensive and strenuous activity for last such as sit-ups, heavy lifting, contact sports, etc  Refrain from any heavy lifting or straining until you  are off narcotics for pain control.   c. DO NOT PUSH THROUGH PAIN.  Let pain be your guide: If it hurts to do something, don't do it.  Pain is your body warning you to avoid that activity for another week until the pain goes down. d. You may drive when you are no longer taking prescription pain medication, you can comfortably wear a seatbelt, and you can safely maneuver your car and apply  brakes. e. Dennis Bast may have sexual intercourse when it is comfortable.  8. FOLLOW UP in our office a. Please call [facility] at [phone] to set up an appointment to see your surgeon in the office for a follow-up appointment approximately 2-3 weeks after your surgery. b. Make sure that you call for this appointment the day you arrive home to insure a convenient appointment time. 9. IF YOU HAVE DISABILITY OR FAMILY LEAVE FORMS, BRING THEM TO THE OFFICE FOR PROCESSING.  DO NOT GIVE THEM TO YOUR DOCTOR.   WHEN TO CALL us [phone]: 1. Poor pain control 2. Reactions / problems with new medications (rash/itching, nausea, etc)  3. Fever over 101.5 F (38.5 C) 4. Worsening swelling or bruising 5. Continued bleeding from incision. 6. Increased pain, redness, or drainage from the incision 7. Difficulty breathing / swallowing   The clinic staff is available to answer your questions during regular business hours (8:30am-5pm).  Please don't hesitate to call and ask to speak to one of our nurses for clinical concerns.   If you have a medical emergency, go to the nearest emergency room or call 911.  A surgeon from [facility] is always on call at the [facility], [address], [place], [place], [place]  [address] ? MAIN: [phone] ? TOLL FREE: [phone] ?  FAX [phone] [email or web address]  Lipoma A lipoma is a noncancerous (benign) tumor composed of fat cells. They are usually found under the skin (subcutaneous). A lipoma may occur in any tissue of the body that contains fat. Common areas for lipomas to appear include the back, shoulders, buttocks, and thighs. Lipomas are a very common soft tissue growth. They are soft and grow slowly. Most problems caused by a lipoma depend on where it is growing. DIAGNOSIS  A lipoma can be diagnosed with a physical exam. These tumors rarely become cancerous, but radiographic studies can help  determine this for certain. Studies used may include:  Computerized X-ray scans (CT or CAT scan).  Computerized magnetic scans (MRI). TREATMENT  Small lipomas that are not causing problems may be watched. If a lipoma continues to enlarge or causes problems, removal is often the best treatment. Lipomas can also be removed to improve appearance. Surgery is done to remove the fatty cells and the surrounding capsule. Most often, this is done with medicine that numbs the area (local anesthetic). The removed tissue is examined under a microscope to make sure it is not cancerous. Keep all follow-up appointments with your caregiver. SEEK MEDICAL CARE IF:   The lipoma becomes larger or hard.  The lipoma becomes painful, red, or increasingly swollen. These could be signs of infection or a more serious condition. Document Released: 11/19/2002 Document Revised: 02/21/2012 Document Reviewed: 05/01/2010 [facility] Patient Information 2014 [place], [place].  Managing Pain  Pain after surgery or related to activity is often due to strain/injury to muscle, tendon, nerves and/or incisions.  This pain is usually short-term and will improve in a few months.   Many  people find it helpful to do the following things TOGETHER to help speed the process of healing and to get back to regular activity more quickly:  1. Avoid heavy physical activity a.  no lifting greater than 20 pounds b. Do not "push through" the pain.  Listen to your body and avoid positions and maneuvers than reproduce the pain c. Walking is okay as tolerated, but go slowly and stop when getting sore.  d. Remember: If it hurts to do it, then don't do it! 2. Take Anti-inflammatory medication  a. Take with food/snack around the clock for 1-2 weeks i. This helps the muscle and nerve tissues become less irritable and calm down faster b. Choose ONE of the following over-the-counter medications: i. Naproxen 220mg  tabs (ex. Aleve) 1-2 pills twice a day    ii. Ibuprofen 200mg  tabs (ex. Advil, Motrin) 3-4 pills with every meal and just before bedtime iii. Acetaminophen 500mg  tabs (Tylenol) 1-2 pills with every meal and just before bedtime 3. Use a Heating pad or Ice/Cold Pack a. 4-6 times a day b. May use warm bath/hottub  or showers 4. Try Gentle Massage and/or Stretching  a. at the area of pain many times a day b. stop if you feel pain - do not overdo it  Try these steps together to help you body heal faster and avoid making things get worse.  Doing just one of these things may not be enough.    If you are not getting better after two weeks or are noticing you are getting worse, contact our office for further advice; we may need to re-evaluate you & see what other things we can do to help.  Muscle Strain A muscle strain is an injury that occurs when a muscle is stretched beyond its normal length. Usually a small number of muscle fibers are torn when this happens. Muscle strain is rated in degrees. First-degree strains have the least amount of muscle fiber tearing and pain. Second-degree and third-degree strains have increasingly more tearing and pain.  Usually, recovery from muscle strain takes 1 2 weeks. Complete healing takes 5 6 weeks.  CAUSES  Muscle strain happens when a sudden, violent force placed on a muscle stretches it too far. This may occur with lifting, sports, or a fall.  RISK FACTORS Muscle strain is especially common in athletes.  SIGNS AND SYMPTOMS At the site of the muscle strain, there may be:  Pain.  Bruising.  Swelling.  Difficulty using the muscle due to pain or lack of normal function. DIAGNOSIS  Your health care provider will perform a physical exam and ask about your medical history. TREATMENT  Often, the best treatment for a muscle strain is resting, icing, and applying cold compresses to the injured area.  HOME CARE INSTRUCTIONS   Use the PRICE method of treatment to promote muscle healing during the  first 2 3 days after your injury. The PRICE method involves:  Protecting the muscle from being injured again.  Restricting your activity and resting the injured body part.  Icing your injury. To do this, put ice in a plastic bag. Place a towel between your skin and the bag. Then, apply the ice and leave it on from 15 20 minutes each hour. After the third day, switch to moist heat packs.  Apply compression to the injured area with a splint or elastic bandage. Be careful not to wrap it too tightly. This may interfere with blood circulation or increase swelling.  Elevate the injured  body part above the level of your heart as often as you can.  Only take over-the-counter or prescription medicines for pain, discomfort, or fever as directed by your health care provider.  Warming up prior to exercise helps to prevent future muscle strains. SEEK MEDICAL CARE IF:   You have increasing pain or swelling in the injured area.  You have numbness, tingling, or a significant loss of strength in the injured area. MAKE SURE YOU:   Understand these instructions.  Will watch your condition.  Will get help right away if you are not doing well or get worse. Document Released: 11/29/2005 Document Revised: 09/19/2013 Document Reviewed: 06/28/2013 [facility] Patient Information 2014 [place], [place].

## 2014-04-08 ENCOUNTER — Ambulatory Visit: Payer: BC Managed Care – PPO | Admitting: Internal Medicine

## 2014-05-15 ENCOUNTER — Ambulatory Visit (INDEPENDENT_AMBULATORY_CARE_PROVIDER_SITE_OTHER): Payer: BC Managed Care – PPO | Admitting: Surgery

## 2014-05-15 ENCOUNTER — Encounter (INDEPENDENT_AMBULATORY_CARE_PROVIDER_SITE_OTHER): Payer: Self-pay | Admitting: Surgery

## 2014-05-15 VITALS — BP 130/76 | HR 67 | Temp 97.2°F | Ht 65.0 in | Wt 161.0 lb

## 2014-05-15 DIAGNOSIS — D171 Benign lipomatous neoplasm of skin and subcutaneous tissue of trunk: Secondary | ICD-10-CM

## 2014-05-15 DIAGNOSIS — R222 Localized swelling, mass and lump, trunk: Secondary | ICD-10-CM | POA: Insufficient documentation

## 2014-05-15 DIAGNOSIS — D1739 Benign lipomatous neoplasm of skin and subcutaneous tissue of other sites: Secondary | ICD-10-CM

## 2014-05-15 DIAGNOSIS — R229 Localized swelling, mass and lump, unspecified: Secondary | ICD-10-CM

## 2014-05-15 NOTE — Progress Notes (Signed)
Subjective:     Patient ID: [name], female   DOB: 08/12/1976, 37 y.o.   MRN: [id number]  HPI   Note: This dictation was prepared with Dragon/digital dictation along with Smartphrase technology. Any transcriptional errors that result from this process are unintentional.       Malik B Carmer  01/07/1976 [id number]  Patient Care Team: Melissa O'Sullivan, NP as PCP - General (Internal Medicine) Sara Swindell Himmelrich, RD as Dietitian (Bariatrics) Stacey A Blyth, MD as Consulting Physician (Family Medicine)  Procedure (Date: 02/28/2014):  POST-OPERATIVE DIAGNOSIS: lower back painful subcutaneous masses   PROCEDURE: Procedure(s):  EXCISION OF BILATERAL DEEP SUBCUTANEOUS BACK MASSES  SURGEON: Surgeon(s):  Cornelius Marullo C. Shamaine Mulkern, MD   Diagnosis 1. Soft tissue mass, simple excision, left posterior flank-back - BENIGN ADIPOSE TISSUE CONSISTENT WITH LIPOMA. 2. Soft tissue mass, simple excision, Right posterior flank-back - BENIGN ADIPOSE TISSUE CONSISTENT WITH LIPOMA. JOHN PATRICK MD Pathologist, Electronic Signature (Case signed 03/01/2014) Specimen Zivah Mayr and Clinical Information Specimen(s) Obtained: 1. Soft tissue mass, simple excision, left posterior flank-back 2. Soft tissue mass, simple excision, Right posterior flank-back Specimen Clinical Information 1. Lower back subcutaneous masses (tl) Rivaan Kendall 1. The specimen is received in formalin and consists of a 4.3 x 4.1 x 2.1 cm aggregate of tan yellow adipose tissue with a small amount of tan pink fibrous tissue. Representative sections are submitted in one cassette. 2. The specimen is received in formalin and consists of a 5.5 x 4.5 x 2.1 cm aggregate of tan yellow adipose tissue with a small amount of tan pink fibrous tissue. Representative sections are submitted in one cassette. ([name] 02-28-14) Report signed out from the following location(s) Technical Component performed at [facility] [address],[place] [address].CLIA:[id number],CAP:[id number]., Interpretation performed at [facility] [address], [place], [place] [address]. CLIA #: [id number],    This patient returns for surgical re-evaluation.  She comes in with her husband.  After the resection of the lipomas on the back, she felt remarkably better immediately.  She had couple of months with no pain.    However, they are concerned a new mass has been found.  Below the left incision on her posterior hip.  Did not notice it until the past few weeks.  Felt soreness first.  Now a mass.  No fevers chills or sweats.  Hoping to travel in the summer so hoping to get this done ASAP.  No fall or trauma.  Does not smoke.  No new lumps or masses anywhere else.  No problems on the right side.  Patient Active Problem List   Diagnosis Date Noted  . Sciatic pain - improving 03/27/2014  . Lipoma of lower back/flank, bilateral, s/p excision 02/24/2014  . Encounter for preconception consultation 02/24/2014  . 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 Sinusitis 11/12/2013  . Anxiety state, unspecified 05/22/2013  . Allergic rhinitis 02/07/2012  . Dyslipidemia 07/28/2011  . Hypertension 01/22/2011    Past Medical History  Diagnosis Date  . Anemia   . Blood in stool   . Chicken pox   . Diverticulitis   . Colon polyps   . Obesity   . Headache(784.0)     migraines  . Fractured toe 09/2013    left big toe about 3 weeks ago  . Seasonal allergies     Stuffy nose  . Hypertension     no problems since 06/2013   . 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   Cesarean section  2006  . Laparoscopic cholecystectomy  2012  . Laparoscopic gastric sleeve resection [name] 06/19/2013    Procedure: LARAPROSCOPIC SLEEVE GASTRECTOMY WITH EGD;  Surgeon: Brian Layton, DO;  Location: [facility];  Service: General;  Laterality: [name];  LARAPROSCOPIC SLEEVE GASTRECTOMY WITH EGD   . Esophagogastroduodenoscopy [name]  06/19/2013    Procedure: ESOPHAGOGASTRODUODENOSCOPY (EGD);  Surgeon: Brian Layton, DO;  Location: [facility];  Service: General;  Laterality: [name];  . Ear cyst excision [name] 10/10/2013    Procedure:  REMOVAL Scalp masses;  Surgeon: Brian Layton, DO;  Location: [facility];  Service: General;  Laterality: [name];  . Mass excision Bilateral 02/28/2014    Procedure: EXCISION OF BILATERAL DEEP SUBCUTANEOUS BACK MASSES;  Surgeon: Johnnye Sandford C. Annelie Boak, MD;  Location: [facility];  Service: General;  Laterality: Bilateral;    History   Social History  . Marital Status: Married    Spouse Name: [name]    Number of Children: [name]  . Years of Education: [name]   Occupational History  . Not on file.   Social History Main Topics  . Smoking status: Former Smoker -- 0.25 packs/day for 2 years    Types: Cigarettes    Quit date: 06/11/1996  . Smokeless tobacco: Never Used  . Alcohol Use: No  . Drug Use: No  . Sexual Activity: Not on file   Other Topics Concern  . Not on file   Social History Narrative  . No narrative on file    Family History  Problem Relation Age of Onset  . Stroke      family hx  . Crohn's disease Father   . Lupus Father   . Ulcerative colitis Father   . Cancer Other     breast    Current Outpatient Prescriptions  Medication Sig Dispense Refill  . Calcium 500 MG CHEW Chew 500 mg by mouth 3 (three) times daily.       . cetirizine (ZYRTEC) 10 MG tablet Take 10 mg by mouth daily.      . Cyanocobalamin (VITAMIN B-12) 1000 MCG SUBL Take 1,000 mcg by mouth every morning.       . fluticasone (FLONASE) 50 MCG/ACT nasal spray Place 2 sprays into both nostrils daily.  16 g  3  . folic acid (FOLVITE) 400 MCG tablet Take 400 mcg by mouth daily.      . ibuprofen (ADVIL,MOTRIN) 200 MG tablet Take 4 tablets (800 mg total) by mouth every 6 (six) hours as needed for fever, headache, mild pain, moderate pain or cramping.  30 tablet  0  . Multiple Vitamins-Minerals (MULTIVITAL) CHEW Chew 1 each by mouth 2 (two)  times daily.      . Prenatal Multivit-Min-Fe-FA (PRENATAL VITAMINS) 0.8 MG tablet Take 1 tablet by mouth daily.       No current facility-administered medications for this visit.     No Known Allergies  BP 130/76  Pulse 67  Temp(Src) 97.2 F (36.2 C)  Ht 5' 5" (1.651 m)  Wt 161 lb (73.029 kg)  BMI 26.79 kg/m2  No results found.   Review of Systems  Constitutional: Negative for fever, chills and diaphoresis.  HENT: Negative for ear pain, sore throat and trouble swallowing.   Eyes: Negative for photophobia and visual disturbance.  Respiratory: Negative for cough and choking.   Cardiovascular: Negative for chest pain and palpitations.  Gastrointestinal: Negative for nausea, vomiting, abdominal pain, diarrhea, constipation, anal bleeding and rectal pain.  Genitourinary: Negative for dysuria, frequency and difficulty   urinating.  Musculoskeletal: Negative for gait problem and myalgias.  Skin: Negative for color change, pallor and rash.  Neurological: Negative for dizziness, speech difficulty, weakness and numbness.  Hematological: Negative for adenopathy.  Psychiatric/Behavioral: Negative for confusion and agitation. The patient is not nervous/anxious.        Objective:   Physical Exam  Constitutional: She is oriented to person, place, and time. She appears well-developed and well-nourished. No distress.  HENT:  Head: Normocephalic.  Mouth/Throat: Oropharynx is clear and moist. No oropharyngeal exudate.  Eyes: Conjunctivae and EOM are normal. Pupils are equal, round, and reactive to light. No scleral icterus.  Neck: Normal range of motion. No tracheal deviation present.  Cardiovascular: Normal rate and intact distal pulses.   Pulmonary/Chest: Effort normal. No respiratory distress. She exhibits no tenderness.  Abdominal: Soft. She exhibits no distension. There is no tenderness. Hernia confirmed negative in the right inguinal area and confirmed negative in the left inguinal  area.  Incisions clean with normal healing ridges.  No hernias  Genitourinary: No vaginal discharge found.  Musculoskeletal: Normal range of motion. She exhibits no tenderness.       Back:  Lymphadenopathy:       Right: No inguinal adenopathy present.       Left: No inguinal adenopathy present.  Neurological: She is alert and oriented to person, place, and time. No cranial nerve deficit. She exhibits normal muscle tone. Coordination normal.  Skin: Skin is warm and dry. No rash noted. She is not diaphoretic.  Psychiatric: She has a normal mood and affect. Her behavior is normal.       Assessment:     Recovering well status post removal of lumbar subcutaneous back masses consistent with lipomas.    New left lower back mass, suspicious for new lipoma     Plan:     I am glad that her preop radiating nerve pain faded away, but I am sorry that she has a new mass.  I am skeptical that was there perioperatively.  Reasonable to remove it vs. Following up in 3 months.  She wishes to remove it before it becomes more painful as the last time she was on a lot of pain by the time I became involved:  The pathophysiology of skin & subcutaneous masses was discussed.  Natural history risks without surgery were discussed.  I recommended surgery to remove the mass.  I explained the technique of removal with use of local anesthesia & possible need for more aggressive sedation/anesthesia for patient comfort.    Risks such as bleeding, infection, heart attack, death, and other risks were discussed.  I noted a good likelihood this will help address the problem.   Possibility that this will not correct all symptoms was explained. Possibility of regrowth/recurrence of the mass was discussed.  We will work to minimize complications. Questions were answered.  The patient expresses understanding & wishes to proceed with surgery.               

## 2014-05-15 NOTE — Patient Instructions (Signed)
Please consider the recommendations that we have given you today:  Consider surgery to remove the mass on her lower back.  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  Please call our office at [phone] if you wish to schedule surgery or if you have further questions / concerns.   Lipoma A lipoma is a noncancerous (benign) tumor composed of fat cells. They are usually found under the skin (subcutaneous). A lipoma may occur in any tissue of the body that contains fat. Common areas for lipomas to appear include the back, shoulders, buttocks, and thighs. Lipomas are a very common soft tissue growth. They are soft and grow slowly. Most problems caused by a lipoma depend on where it is growing. DIAGNOSIS  A lipoma can be diagnosed with a physical exam. These tumors rarely become cancerous, but radiographic studies can help determine this for certain. Studies used may include:  Computerized X-ray scans (CT or CAT scan).  Computerized magnetic scans (MRI). TREATMENT  Small lipomas that are not causing problems may be watched. If a lipoma continues to enlarge or causes problems, removal is often the best treatment. Lipomas can also be removed to improve appearance. Surgery is done to remove the fatty cells and the surrounding capsule. Most often, this is done with medicine that numbs the area (local anesthetic). The removed tissue is examined under a microscope to make sure it is not cancerous. Keep all follow-up appointments with your caregiver. SEEK MEDICAL CARE IF:   The lipoma becomes larger or hard.  The lipoma becomes painful, red, or increasingly swollen. These could be signs of infection or a more serious condition. Document Released: 11/19/2002 Document Revised: 02/21/2012 Document Reviewed: 05/01/2010 [facility] Patient Information 2014 [place], [place].  Managing Pain  Pain after surgery or related to activity is often due to strain/injury to muscle, tendon,  nerves and/or incisions.  This pain is usually short-term and will improve in a few months.   Many people find it helpful to do the following things TOGETHER to help speed the process of healing and to get back to regular activity more quickly:  1. Avoid heavy physical activity a.  no lifting greater than 20 pounds b. Do not "push through" the pain.  Listen to your body and avoid positions and maneuvers than reproduce the pain c. Walking is okay as tolerated, but go slowly and stop when getting sore.  d. Remember: If it hurts to do it, then don't do it! 2. Take Anti-inflammatory medication  a. Take with food/snack around the clock for 1-2 weeks i. This helps the muscle and nerve tissues become less irritable and calm down faster b. Choose ONE of the following over-the-counter medications: i. Naproxen 220mg  tabs (ex. Aleve) 1-2 pills twice a day  ii. Ibuprofen 200mg  tabs (ex. Advil, Motrin) 3-4 pills with every meal and just before bedtime iii. Acetaminophen 500mg  tabs (Tylenol) 1-2 pills with every meal and just before bedtime 3. Use a Heating pad or Ice/Cold Pack a. 4-6 times a day b. May use warm bath/hottub  or showers 4. Try Gentle Massage and/or Stretching  a. at the area of pain many times a day b. stop if you feel pain - do not overdo it  Try these steps together to help you body heal faster and avoid making things get worse.  Doing just one of these things may not be enough.    If you are not getting better after two weeks or are  noticing you are getting worse, contact our office for further advice; we may need to re-evaluate you & see what other things we can do to help.  GENERAL SURGERY: POST OP INSTRUCTIONS  1. DIET: Follow a light bland diet the first 24 hours after arrival home, such as soup, liquids, crackers, etc.  Be sure to include lots of fluids daily.  Avoid fast food or heavy meals as your are more likely to get nauseated.   2. Take your usually prescribed home  medications unless otherwise directed. 3. PAIN CONTROL: a. Pain is best controlled by a usual combination of three different methods TOGETHER: i. Ice/Heat ii. Over the counter pain medication iii. Prescription pain medication b. Most patients will experience some swelling and bruising around the incisions.  Ice packs or heating pads (30-60 minutes up to 6 times a day) will help. Use ice for the first few days to help decrease swelling and bruising, then switch to heat to help relax tight/sore spots and speed recovery.  Some people prefer to use ice alone, heat alone, alternating between ice & heat.  Experiment to what works for you.  Swelling and bruising can take several weeks to resolve.   c. It is helpful to take an over-the-counter pain medication regularly for the first few weeks.  Choose one of the following that works best for you: i. Naproxen (Aleve, etc)  Two 220mg  tabs twice a day ii. Ibuprofen (Advil, etc) Three 200mg  tabs four times a day (every meal & bedtime) iii. Acetaminophen (Tylenol, etc) 500-650mg  four times a day (every meal & bedtime) d. A  prescription for pain medication (such as oxycodone, hydrocodone, etc) should be given to you upon discharge.  Take your pain medication as prescribed.  i. If you are having problems/concerns with the prescription medicine (does not control pain, nausea, vomiting, rash, itching, etc), please call us [phone] to see if we need to switch you to a different pain medicine that will work better for you and/or control your side effect better. ii. If you need a refill on your pain medication, please contact your pharmacy.  They will contact our office to request authorization. Prescriptions will not be filled after 5 pm or on week-ends. 4. Avoid getting constipated.  Between the surgery and the pain medications, it is common to experience some constipation.  Increasing fluid intake and taking a fiber supplement (such as Metamucil, Citrucel,  FiberCon, MiraLax, etc) 1-2 times a day regularly will usually help prevent this problem from occurring.  A mild laxative (prune juice, Milk of Magnesia, MiraLax, etc) should be taken according to package directions if there are no bowel movements after 48 hours.   5. Wash / shower every day.  You may shower over the dressings as they are waterproof.  Continue to shower over incision(s) after the dressing is off. 6. Remove your waterproof bandages 5 days after surgery.  You may leave the incision open to air.  You may have skin tapes (Steri Strips) covering the incision(s).  Leave them on until one week, then remove.  You may replace a dressing/Band-Aid to cover the incision for comfort if you wish.      7. ACTIVITIES as tolerated:   a. You may resume regular (light) daily activities beginning the next day-such as daily self-care, walking, climbing stairs-gradually increasing activities as tolerated.  If you can walk 30 minutes without difficulty, it is safe to try more intense activity such as jogging, treadmill, bicycling, low-impact aerobics, swimming,  etc. b. Save the most intensive and strenuous activity for last such as sit-ups, heavy lifting, contact sports, etc  Refrain from any heavy lifting or straining until you are off narcotics for pain control.   c. DO NOT PUSH THROUGH PAIN.  Let pain be your guide: If it hurts to do something, don't do it.  Pain is your body warning you to avoid that activity for another week until the pain goes down. d. You may drive when you are no longer taking prescription pain medication, you can comfortably wear a seatbelt, and you can safely maneuver your car and apply brakes. e. Dennis Bast may have sexual intercourse when it is comfortable.  8. FOLLOW UP in our office a. Please call [facility] at [phone] to set up an appointment to see your surgeon in the office for a follow-up appointment approximately 2-3 weeks after your surgery. b. Make sure that you call for this  appointment the day you arrive home to insure a convenient appointment time. 9. IF YOU HAVE DISABILITY OR FAMILY LEAVE FORMS, BRING THEM TO THE OFFICE FOR PROCESSING.  DO NOT GIVE THEM TO YOUR DOCTOR.   WHEN TO CALL us [phone]: 1. Poor pain control 2. Reactions / problems with new medications (rash/itching, nausea, etc)  3. Fever over 101.5 F (38.5 C) 4. Worsening swelling or bruising 5. Continued bleeding from incision. 6. Increased pain, redness, or drainage from the incision 7. Difficulty breathing / swallowing   The clinic staff is available to answer your questions during regular business hours (8:30am-5pm).  Please don't hesitate to call and ask to speak to one of our nurses for clinical concerns.   If you have a medical emergency, go to the nearest emergency room or call 911.  A surgeon from [facility] is always on call at the [facility], [place], [place], [place], [place]  [address] ? MAIN: [phone] ? TOLL FREE: [phone] ?  FAX [phone] [email or web address]

## 2014-05-16 ENCOUNTER — Encounter (HOSPITAL_COMMUNITY): Payer: Self-pay | Admitting: Pharmacy Technician

## 2014-05-20 ENCOUNTER — Encounter (HOSPITAL_COMMUNITY): Payer: Self-pay

## 2014-05-20 ENCOUNTER — Encounter (HOSPITAL_COMMUNITY)
Admission: RE | Admit: 2014-05-20 | Discharge: 2014-05-20 | Disposition: A | Discharge status: Home / Self Care | Payer: BC Managed Care – PPO | Source: Ambulatory Visit | Attending: Surgery | Admitting: Surgery

## 2014-05-20 LAB — CBC
HCT: 41.3 % (ref 36.0–46.0)
Hemoglobin: 13.5 g/dL (ref 12.0–15.0)
MCH: 26.5 pg (ref 26.0–34.0)
MCHC: 32.7 g/dL (ref 30.0–36.0)
MCV: 81.1 fL (ref 78.0–100.0)
PLATELETS: 299 10*3/uL (ref 150–400)
RBC: 5.09 MIL/uL (ref 3.87–5.11)
RDW: 13.8 % (ref 11.5–15.5)
WBC: 6.3 10*3/uL (ref 4.0–10.5)

## 2014-05-20 LAB — HCG, SERUM, QUALITATIVE: PREG SERUM: NEGATIVE

## 2014-05-20 MED ORDER — CHLORHEXIDINE GLUCONATE 4 % EX LIQD: 1.0000 "application " | Freq: Once | CUTANEOUS | Status: DC

## 2014-05-20 NOTE — Pre-Procedure Instructions (Signed)
EKG AND CXR NOT NEEDED PREOP - PER ANESTHESIOLOGIST'S GUIDELINES. 

## 2014-05-20 NOTE — Patient Instructions (Signed)
YOUR SURGERY IS SCHEDULED AT [facility]  ON:  Thursday  6/11  REPORT TO  SHORT STAY CENTER AT:  1:15 PM   PLEASE COME IN THE [facility] MAIN HOSPITAL ENTRANCE AND FOLLOW SIGNS TO SHORT STAY CENTER.  DO NOT EAT ANYTHING AFTER MIDNIGHT THE NIGHT BEFORE YOUR SURGERY.   NO FOOD, NO CHEWING GUM, NO MINTS, NO CANDIES, NO CHEWING TOBACCO.    YOU MAY HAVE CLEAR LIQUIDS TO DRINK FROM MIDNIGHT THE NIGHT BEFORE SURGERY - UNTIL 9:15 AM DAY OF SURGERY - LIKE WATER.   NOTHING TO DRINK AFTER 9:15 AM DAY OF SURGERY.  PLEASE TAKE THE FOLLOWING MEDICATIONS THE AM OF YOUR SURGERY WITH A FEW SIPS OF WATER:  NO MEDS TO TAKE   DO NOT BRING VALUABLES, MONEY, CREDIT CARDS.  DO NOT WEAR JEWELRY, MAKE-UP, NAIL POLISH AND NO METAL PINS OR CLIPS IN YOUR HAIR. CONTACT LENS, DENTURES / PARTIALS, GLASSES SHOULD NOT BE WORN TO SURGERY AND IN MOST CASES-HEARING AIDS WILL NEED TO BE REMOVED.  BRING YOUR GLASSES CASE, ANY EQUIPMENT NEEDED FOR YOUR CONTACT LENS. FOR PATIENTS ADMITTED TO THE HOSPITAL--CHECK OUT TIME THE DAY OF DISCHARGE IS 11:00 AM.  ALL INPATIENT ROOMS ARE PRIVATE - WITH BATHROOM, TELEPHONE, TELEVISION AND WIFI INTERNET.  IF YOU ARE BEING DISCHARGED THE SAME DAY OF YOUR SURGERY--YOU CAN NOT DRIVE YOURSELF HOME--AND SHOULD NOT GO HOME ALONE BY TAXI OR BUS.  NO DRIVING OR OPERATING MACHINERY, OR MAKING LEGAL DECISIONS FOR 24 HOURS FOLLOWING ANESTHESIA / PAIN MEDICATIONS.  PLEASE MAKE ARRANGEMENTS FOR SOMEONE TO BE WITH YOU AT HOME THE FIRST 24 HOURS AFTER SURGERY. RESPONSIBLE DRIVER'S NAME / PHONE   PT'S HUSBAND WILL BE WITH HER                                                  PLEASE BE AWARE THAT YOU MAY NEED ADDITIONAL BLOOD DRAWN DAY OF YOUR SURGERY  _______________________________________________________________________   [facility] - Preparing for Surgery Before surgery, you can play an important role.  Because skin is not sterile, your skin needs to be as free of germs as possible.  You  can reduce the number of germs on your skin by washing with CHG (chlorahexidine gluconate) soap before surgery.  CHG is an antiseptic cleaner which kills germs and bonds with the skin to continue killing germs even after washing. Please DO NOT use if you have an allergy to CHG or antibacterial soaps.  If your skin becomes reddened/irritated stop using the CHG and inform your nurse when you arrive at Short Stay. Do not shave (including legs and underarms) for at least 48 hours prior to the first CHG shower.  You may shave your face/neck. Please follow these instructions carefully:  1.  Shower with CHG Soap the night before surgery and the  morning of Surgery.  2.  If you choose to wash your hair, wash your hair first as usual with your  normal  shampoo.  3.  After you shampoo, rinse your hair and body thoroughly to remove the  shampoo.                           4.  Use CHG as you would any other liquid soap.  You can apply chg directly  to the skin and wash  Gently with a scrungie or clean washcloth.  5.  Apply the CHG Soap to your body ONLY FROM THE NECK DOWN.   Do not use on face/ open                           Wound or open sores. Avoid contact with eyes, ears mouth and genitals (private parts).                       Wash face,  Genitals (private parts) with your normal soap.             6.  Wash thoroughly, paying special attention to the area where your surgery  will be performed.  7.  Thoroughly rinse your body with warm water from the neck down.  8.  DO NOT shower/wash with your normal soap after using and rinsing off  the CHG Soap.                9.  Pat yourself dry with a clean towel.            10.  Wear clean pajamas.            11.  Place clean sheets on your bed the night of your first shower and do not  sleep with pets. Day of Surgery : Do not apply any lotions/deodorants the morning of surgery.  Please wear clean clothes to the hospital/surgery center.  FAILURE  TO FOLLOW THESE INSTRUCTIONS MAY RESULT IN THE CANCELLATION OF YOUR SURGERY PATIENT SIGNATURE_________________________________  NURSE SIGNATURE__________________________________  ________________________________________________________________________

## 2014-05-22 ENCOUNTER — Telehealth (INDEPENDENT_AMBULATORY_CARE_PROVIDER_SITE_OTHER): Payer: Self-pay | Admitting: *Deleted

## 2014-05-22 NOTE — Telephone Encounter (Signed)
Pt called to advise Dr. Johney [place] that she now is hurting on her right side, and she now feels some lumps there as well.  She wanted to let you know because she is scheduled for sx tomorrow, 05/23/14 on her left side and she said she didn't want you to be surprised.  I advised pt that I wasn't sure what, if anything, Dr. Johney [place] would do since sx is scheduled so soon.  Pt just wants him to know.  Pt verbalized understanding!  Anderson [place]

## 2014-05-22 NOTE — Telephone Encounter (Signed)
Called pt to notify her that Dr Johney [place] has been made aware of her message and he will talk to her tomorrow. Pt understands.

## 2014-05-23 ENCOUNTER — Ambulatory Visit (HOSPITAL_COMMUNITY)
Admission: RE | Admit: 2014-05-23 | Discharge: 2014-05-23 | Disposition: A | Discharge status: Home / Self Care | Payer: BC Managed Care – PPO | Source: Ambulatory Visit | Attending: Surgery | Admitting: Surgery

## 2014-05-23 ENCOUNTER — Ambulatory Visit (HOSPITAL_COMMUNITY): Payer: BC Managed Care – PPO | Admitting: Registered Nurse

## 2014-05-23 ENCOUNTER — Encounter (HOSPITAL_COMMUNITY): Payer: BC Managed Care – PPO | Admitting: Registered Nurse

## 2014-05-23 ENCOUNTER — Encounter (HOSPITAL_COMMUNITY): Payer: Self-pay | Admitting: *Deleted

## 2014-05-23 ENCOUNTER — Encounter (HOSPITAL_COMMUNITY)
Admission: RE | Disposition: A | Discharge status: Home / Self Care | Payer: Self-pay | Source: Ambulatory Visit | Attending: Surgery

## 2014-05-23 DIAGNOSIS — Z79899 Other long term (current) drug therapy: Secondary | ICD-10-CM | POA: Insufficient documentation

## 2014-05-23 DIAGNOSIS — D1739 Benign lipomatous neoplasm of skin and subcutaneous tissue of other sites: Secondary | ICD-10-CM

## 2014-05-23 DIAGNOSIS — I10 Essential (primary) hypertension: Secondary | ICD-10-CM | POA: Insufficient documentation

## 2014-05-23 DIAGNOSIS — R222 Localized swelling, mass and lump, trunk: Secondary | ICD-10-CM | POA: Diagnosis present

## 2014-05-23 DIAGNOSIS — Z87891 Personal history of nicotine dependence: Secondary | ICD-10-CM | POA: Insufficient documentation

## 2014-05-23 DIAGNOSIS — D171 Benign lipomatous neoplasm of skin and subcutaneous tissue of trunk: Secondary | ICD-10-CM | POA: Diagnosis present

## 2014-05-23 HISTORY — PX: MASS EXCISION: SHX2000

## 2014-05-23 SURGERY — EXCISION MASS
Anesthesia: General | Site: Back | Laterality: Bilateral

## 2014-05-23 MED ORDER — MAGIC MOUTHWASH
15.0000 mL | Freq: Four times a day (QID) | ORAL | Status: DC | PRN
  Filled 2014-05-23: qty 15

## 2014-05-23 MED ORDER — DIPHENHYDRAMINE HCL 50 MG/ML IJ SOLN: 12.5000 mg | Freq: Four times a day (QID) | INTRAMUSCULAR | Status: DC | PRN

## 2014-05-23 MED ORDER — OXYCODONE HCL 5 MG PO TABS
5.0000 mg | ORAL_TABLET | ORAL | Status: DC | PRN
  Administered 2014-05-23: 5 mg via ORAL
  Filled 2014-05-23: qty 1

## 2014-05-23 MED ORDER — SUCCINYLCHOLINE CHLORIDE 20 MG/ML IJ SOLN
INTRAMUSCULAR | Status: DC | PRN
  Administered 2014-05-23: 100 mg via INTRAVENOUS

## 2014-05-23 MED ORDER — ALUM & MAG HYDROXIDE-SIMETH 200-200-20 MG/5ML PO SUSP
30.0000 mL | Freq: Four times a day (QID) | ORAL | Status: DC | PRN
  Filled 2014-05-23: qty 30

## 2014-05-23 MED ORDER — OXYCODONE HCL 5 MG PO TABS: 5.0000 mg | ORAL_TABLET | ORAL | Status: DC | PRN

## 2014-05-23 MED ORDER — PROPOFOL 10 MG/ML IV BOLUS
INTRAVENOUS | Status: AC
  Filled 2014-05-23: qty 20

## 2014-05-23 MED ORDER — ACETAMINOPHEN 650 MG RE SUPP
650.0000 mg | Freq: Four times a day (QID) | RECTAL | Status: DC | PRN
  Filled 2014-05-23: qty 1

## 2014-05-23 MED ORDER — ONDANSETRON HCL 4 MG/2ML IJ SOLN
INTRAMUSCULAR | Status: DC | PRN
  Administered 2014-05-23: 4 mg via INTRAVENOUS

## 2014-05-23 MED ORDER — PHENOL 1.4 % MT LIQD
2.0000 | OROMUCOSAL | Status: DC | PRN
  Filled 2014-05-23: qty 177

## 2014-05-23 MED ORDER — FENTANYL CITRATE 0.05 MG/ML IJ SOLN
25.0000 ug | INTRAMUSCULAR | Status: DC | PRN
  Administered 2014-05-23 (×2): 25 ug via INTRAVENOUS
  Administered 2014-05-23: 50 ug via INTRAVENOUS

## 2014-05-23 MED ORDER — DEXAMETHASONE SODIUM PHOSPHATE 10 MG/ML IJ SOLN
INTRAMUSCULAR | Status: AC
  Filled 2014-05-23: qty 1

## 2014-05-23 MED ORDER — KETOROLAC TROMETHAMINE 30 MG/ML IJ SOLN
INTRAMUSCULAR | Status: AC
  Filled 2014-05-23: qty 1

## 2014-05-23 MED ORDER — LACTATED RINGERS IV BOLUS (SEPSIS): 1000.0000 mL | Freq: Three times a day (TID) | INTRAVENOUS | Status: DC | PRN

## 2014-05-23 MED ORDER — LIDOCAINE HCL (CARDIAC) 20 MG/ML IV SOLN
INTRAVENOUS | Status: DC | PRN
  Administered 2014-05-23: 100 mg via INTRAVENOUS

## 2014-05-23 MED ORDER — CEFAZOLIN SODIUM-DEXTROSE 2-3 GM-% IV SOLR
INTRAVENOUS | Status: AC
  Filled 2014-05-23: qty 50

## 2014-05-23 MED ORDER — KETOROLAC TROMETHAMINE 30 MG/ML IJ SOLN
INTRAMUSCULAR | Status: DC | PRN
  Administered 2014-05-23: 30 mg via INTRAVENOUS

## 2014-05-23 MED ORDER — BUPIVACAINE-EPINEPHRINE 0.25% -1:200000 IJ SOLN
INTRAMUSCULAR | Status: AC
  Filled 2014-05-23: qty 1

## 2014-05-23 MED ORDER — ACETAMINOPHEN 325 MG PO TABS: 325.0000 mg | ORAL_TABLET | Freq: Four times a day (QID) | ORAL | Status: DC | PRN

## 2014-05-23 MED ORDER — PROMETHAZINE HCL 25 MG/ML IJ SOLN: 6.2500 mg | Freq: Four times a day (QID) | INTRAMUSCULAR | Status: DC | PRN

## 2014-05-23 MED ORDER — LACTATED RINGERS IV SOLN
INTRAVENOUS | Status: DC
  Administered 2014-05-23 (×2): 1000 mL via INTRAVENOUS

## 2014-05-23 MED ORDER — FENTANYL CITRATE 0.05 MG/ML IJ SOLN
INTRAMUSCULAR | Status: AC
  Filled 2014-05-23: qty 5

## 2014-05-23 MED ORDER — FENTANYL CITRATE 0.05 MG/ML IJ SOLN
INTRAMUSCULAR | Status: DC | PRN
  Administered 2014-05-23 (×5): 50 ug via INTRAVENOUS

## 2014-05-23 MED ORDER — DEXAMETHASONE SODIUM PHOSPHATE 10 MG/ML IJ SOLN
INTRAMUSCULAR | Status: DC | PRN
  Administered 2014-05-23: 10 mg via INTRAVENOUS

## 2014-05-23 MED ORDER — CEFAZOLIN SODIUM-DEXTROSE 2-3 GM-% IV SOLR
2.0000 g | INTRAVENOUS | Status: AC
  Administered 2014-05-23: 2 g via INTRAVENOUS

## 2014-05-23 MED ORDER — ONDANSETRON HCL 4 MG/2ML IJ SOLN
INTRAMUSCULAR | Status: AC
  Filled 2014-05-23: qty 2

## 2014-05-23 MED ORDER — LACTATED RINGERS IV SOLN: INTRAVENOUS | Status: DC

## 2014-05-23 MED ORDER — PROPOFOL 10 MG/ML IV BOLUS
INTRAVENOUS | Status: DC | PRN
  Administered 2014-05-23: 150 mg via INTRAVENOUS

## 2014-05-23 MED ORDER — FENTANYL CITRATE 0.05 MG/ML IJ SOLN
INTRAMUSCULAR | Status: AC
  Filled 2014-05-23: qty 2

## 2014-05-23 MED ORDER — MIDAZOLAM HCL 2 MG/2ML IJ SOLN
INTRAMUSCULAR | Status: AC
  Filled 2014-05-23: qty 2

## 2014-05-23 MED ORDER — LIDOCAINE HCL (CARDIAC) 20 MG/ML IV SOLN
INTRAVENOUS | Status: AC
  Filled 2014-05-23: qty 5

## 2014-05-23 MED ORDER — METOPROLOL TARTRATE 1 MG/ML IV SOLN: 5.0000 mg | Freq: Four times a day (QID) | INTRAVENOUS | Status: DC | PRN

## 2014-05-23 MED ORDER — 0.9 % SODIUM CHLORIDE (POUR BTL) OPTIME
TOPICAL | Status: DC | PRN
  Administered 2014-05-23: 1000 mL

## 2014-05-23 MED ORDER — MIDAZOLAM HCL 5 MG/5ML IJ SOLN
INTRAMUSCULAR | Status: DC | PRN
  Administered 2014-05-23: 2 mg via INTRAVENOUS

## 2014-05-23 MED ORDER — MENTHOL 3 MG MT LOZG
1.0000 | LOZENGE | OROMUCOSAL | Status: DC | PRN
  Filled 2014-05-23: qty 9

## 2014-05-23 MED ORDER — BUPIVACAINE-EPINEPHRINE 0.25% -1:200000 IJ SOLN
INTRAMUSCULAR | Status: DC | PRN
  Administered 2014-05-23: 50 mL

## 2014-05-23 MED ORDER — LIP MEDEX EX OINT
1.0000 "application " | TOPICAL_OINTMENT | Freq: Two times a day (BID) | CUTANEOUS | Status: DC
  Filled 2014-05-23: qty 7

## 2014-05-23 MED ORDER — IBUPROFEN 400 MG PO TABS
400.0000 mg | ORAL_TABLET | Freq: Four times a day (QID) | ORAL | Status: DC | PRN
  Filled 2014-05-23: qty 2

## 2014-05-23 SURGICAL SUPPLY — 32 items
BENZOIN TINCTURE PRP APPL 2/3 (GAUZE/BANDAGES/DRESSINGS) IMPLANT
BLADE HEX COATED 2.75 (ELECTRODE) ×2 IMPLANT
CANISTER SUCTION 2500CC (MISCELLANEOUS) IMPLANT
DECANTER SPIKE VIAL GLASS SM (MISCELLANEOUS) ×2 IMPLANT
DRAPE LAPAROTOMY T 102X78X121 (DRAPES) IMPLANT
DRAPE LAPAROTOMY TRNSV 102X78 (DRAPE) ×2 IMPLANT
DRSG TEGADERM 4X4.75 (GAUZE/BANDAGES/DRESSINGS) ×2 IMPLANT
ELECT REM PT RETURN 9FT ADLT (ELECTROSURGICAL) ×2
ELECTRODE REM PT RTRN 9FT ADLT (ELECTROSURGICAL) ×1 IMPLANT
GAUZE SPONGE 2X2 8PLY STRL LF (GAUZE/BANDAGES/DRESSINGS) ×1 IMPLANT
GAUZE SPONGE 4X4 12PLY STRL (GAUZE/BANDAGES/DRESSINGS) ×2 IMPLANT
GLOVE BIOGEL PI IND STRL 7.0 (GLOVE) ×1 IMPLANT
GLOVE BIOGEL PI INDICATOR 7.0 (GLOVE) ×1
GLOVE ECLIPSE 8.0 STRL XLNG CF (GLOVE) ×2 IMPLANT
GLOVE INDICATOR 8.0 STRL GRN (GLOVE) ×2 IMPLANT
GOWN STRL REUS W/TWL LRG LVL3 (GOWN DISPOSABLE) ×2 IMPLANT
GOWN STRL REUS W/TWL XL LVL3 (GOWN DISPOSABLE) ×2 IMPLANT
KIT BASIN OR (CUSTOM PROCEDURE TRAY) ×2 IMPLANT
MANIFOLD NEPTUNE II (INSTRUMENTS) ×2 IMPLANT
NEEDLE HYPO 22GX1.5 SAFETY (NEEDLE) ×2 IMPLANT
NS IRRIG 1000ML POUR BTL (IV SOLUTION) ×2 IMPLANT
PACK GENERAL/GYN (CUSTOM PROCEDURE TRAY) ×2 IMPLANT
PEN SKIN MARKING BROAD (MISCELLANEOUS) IMPLANT
SPONGE GAUZE 2X2 STER 10/PKG (GAUZE/BANDAGES/DRESSINGS) ×1
SPONGE LAP 4X18 X RAY DECT (DISPOSABLE) IMPLANT
STRIP CLOSURE SKIN 1/2X4 (GAUZE/BANDAGES/DRESSINGS) ×2 IMPLANT
SUT MNCRL AB 4-0 PS2 18 (SUTURE) ×2 IMPLANT
SUT VIC AB 2-0 SH 18 (SUTURE) ×4 IMPLANT
SUT VIC AB 3-0 SH 27 (SUTURE) ×1
SUT VIC AB 3-0 SH 27XBRD (SUTURE) ×1 IMPLANT
SYR 20CC LL (SYRINGE) ×2 IMPLANT
TOWEL OR 17X26 10 PK STRL BLUE (TOWEL DISPOSABLE) ×2 IMPLANT

## 2014-05-23 NOTE — Discharge Instructions (Signed)
GENERAL SURGERY: POST OP INSTRUCTIONS ° °1. DIET: Follow a light bland diet the first 24 hours after arrival home, such as soup, liquids, crackers, etc.  Be sure to include lots of fluids daily.  Avoid fast food or heavy meals as your are more likely to get nauseated.   °2. Take your usually prescribed home medications unless otherwise directed. °3. PAIN CONTROL: °a. Pain is best controlled by a usual combination of three different methods TOGETHER: °i. Ice/Heat °ii. Over the counter pain medication °iii. Prescription pain medication °b. Most patients will experience some swelling and bruising around the incisions.  Ice packs or heating pads (30-60 minutes up to 6 times a day) will help. Use ice for the first few days to help decrease swelling and bruising, then switch to heat to help relax tight/sore spots and speed recovery.  Some people prefer to use ice alone, heat alone, alternating between ice & heat.  Experiment to what works for you.  Swelling and bruising can take several weeks to resolve.   °c. It is helpful to take an over-the-counter pain medication regularly for the first few weeks.  Choose one of the following that works best for you: °i. Naproxen (Aleve, etc)  Two 220mg tabs twice a day °ii. Ibuprofen (Advil, etc) Three 200mg tabs four times a day (every meal & bedtime) °iii. Acetaminophen (Tylenol, etc) 500-650mg four times a day (every meal & bedtime) °d. A  prescription for pain medication (such as oxycodone, hydrocodone, etc) should be given to you upon discharge.  Take your pain medication as prescribed.  °i. If you are having problems/concerns with the prescription medicine (does not control pain, nausea, vomiting, rash, itching, etc), please call us [phone] to see if we need to switch you to a different pain medicine that will work better for you and/or control your side effect better. °ii. If you need a refill on your pain medication, please contact your pharmacy.  They will contact our  office to request authorization. Prescriptions will not be filled after 5 pm or on week-ends. °4. Avoid getting constipated.  Between the surgery and the pain medications, it is common to experience some constipation.  Increasing fluid intake and taking a fiber supplement (such as Metamucil, Citrucel, FiberCon, MiraLax, etc) 1-2 times a day regularly will usually help prevent this problem from occurring.  A mild laxative (prune juice, Milk of Magnesia, MiraLax, etc) should be taken according to package directions if there are no bowel movements after 48 hours.   °5. Wash / shower every day.  You may shower over the dressings as they are waterproof.  Continue to shower over incision(s) after the dressing is off. °6. Remove your waterproof bandages 5 days after surgery.  You may leave the incision open to air.  You may have skin tapes (Steri Strips) covering the incision(s).  Leave them on until one week, then remove.  You may replace a dressing/Band-Aid to cover the incision for comfort if you wish.  ° ° ° ° °7. ACTIVITIES as tolerated:   °a. You may resume regular (light) daily activities beginning the next day--such as daily self-care, walking, climbing stairs--gradually increasing activities as tolerated.  If you can walk 30 minutes without difficulty, it is safe to try more intense activity such as jogging, treadmill, bicycling, low-impact aerobics, swimming, etc. °b. Save the most intensive and strenuous activity for last such as sit-ups, heavy lifting, contact sports, etc  Refrain from any heavy lifting or straining until you   are off narcotics for pain control.   c. DO NOT PUSH THROUGH PAIN.  Let pain be your guide: If it hurts to do something, don't do it.  Pain is your body warning you to avoid that activity for another week until the pain goes down. d. You may drive when you are no longer taking prescription pain medication, you can comfortably wear a seatbelt, and you can safely maneuver your car and  apply brakes. e. Dennis Bast may have sexual intercourse when it is comfortable.  8. FOLLOW UP in our office a. Please call [facility] at [phone] to set up an appointment to see your surgeon in the office for a follow-up appointment approximately 2-3 weeks after your surgery. b. Make sure that you call for this appointment the day you arrive home to insure a convenient appointment time. 9. IF YOU HAVE DISABILITY OR FAMILY LEAVE FORMS, BRING THEM TO THE OFFICE FOR PROCESSING.  DO NOT GIVE THEM TO YOUR DOCTOR.   WHEN TO CALL us [phone]: 1. Poor pain control 2. Reactions / problems with new medications (rash/itching, nausea, etc)  3. Fever over 101.5 F (38.5 C) 4. Worsening swelling or bruising 5. Continued bleeding from incision. 6. Increased pain, redness, or drainage from the incision 7. Difficulty breathing / swallowing   The clinic staff is available to answer your questions during regular business hours (8:30am-5pm).  Please dont hesitate to call and ask to speak to one of our nurses for clinical concerns.   If you have a medical emergency, go to the nearest emergency room or call 911.  A surgeon from [facility] is always on call at the [facility], [place], [place], [place], [address]  [address] ? MAIN: [phone] ? TOLL FREE: [phone] ?  FAX [phone] [email or web address]  Managing Pain  Pain after surgery or related to activity is often due to strain/injury to muscle, tendon, nerves and/or incisions.  This pain is usually short-term and will improve in a few months.   Many people find it helpful to do the following things TOGETHER to help speed the process of healing and to get back to regular activity more quickly:  1. Avoid heavy physical activity a.  no lifting greater than 20 pounds b. Do not push through the pain.  Listen to your body and avoid positions and maneuvers than reproduce the  pain c. Walking is okay as tolerated, but go slowly and stop when getting sore.  d. Remember: If it hurts to do it, then dont do it! 2. Take Anti-inflammatory medication  a. Take with food/snack around the clock for 1-2 weeks i. This helps the muscle and nerve tissues become less irritable and calm down faster b. Choose ONE of the following over-the-counter medications: i. Naproxen 220mg  tabs (ex. Aleve) 1-2 pills twice a day  ii. Ibuprofen 200mg  tabs (ex. Advil, Motrin) 3-4 pills with every meal and just before bedtime iii. Acetaminophen 500mg  tabs (Tylenol) 1-2 pills with every meal and just before bedtime 3. Use a Heating pad or Ice/Cold Pack a. 4-6 times a day b. May use warm bath/hottub  or showers 4. Try Gentle Massage and/or Stretching  a. at the area of pain many times a day b. stop if you feel pain - do not overdo it  Try these steps together to help you body heal faster and avoid making things get worse.  Doing just one of these things may not be  enough.    If you are not getting better after two weeks or are noticing you are getting worse, contact our office for further advice; we may need to re-evaluate you & see what other things we can do to help.  Lipoma A lipoma is a noncancerous (benign) tumor composed of fat cells. They are usually found under the skin (subcutaneous). A lipoma may occur in any tissue of the body that contains fat. Common areas for lipomas to appear include the back, shoulders, buttocks, and thighs. Lipomas are a very common soft tissue growth. They are soft and grow slowly. Most problems caused by a lipoma depend on where it is growing. DIAGNOSIS  A lipoma can be diagnosed with a physical exam. These tumors rarely become cancerous, but radiographic studies can help determine this for certain. Studies used may include:  Computerized X-ray scans (CT or CAT scan).  Computerized magnetic scans (MRI). TREATMENT  Small lipomas that are not causing problems  may be watched. If a lipoma continues to enlarge or causes problems, removal is often the best treatment. Lipomas can also be removed to improve appearance. Surgery is done to remove the fatty cells and the surrounding capsule. Most often, this is done with medicine that numbs the area (local anesthetic). The removed tissue is examined under a microscope to make sure it is not cancerous. Keep all follow-up appointments with your caregiver. SEEK MEDICAL CARE IF:   The lipoma becomes larger or hard.  The lipoma becomes painful, red, or increasingly swollen. These could be signs of infection or a more serious condition. Document Released: 11/19/2002 Document Revised: 02/21/2012 Document Reviewed: 05/01/2010 [facility] Patient Information 2014 [place], [place].

## 2014-05-23 NOTE — Transfer of Care (Signed)
Immediate Anesthesia Transfer of Care Note  Patient: [name]  Procedure(s) Performed: Procedure(s): EXCISION MASS OF LOWER BACK BILATERAL (Bilateral)  Patient Location: PACU  Anesthesia Type:General  Level of Consciousness: awake, alert , oriented and patient cooperative  Airway & Oxygen Therapy: Patient Spontanous Breathing and Patient connected to nasal cannula oxygen  Post-op Assessment: Report given to PACU RN, Post -op Vital signs reviewed and stable and Patient moving all extremities X 4  Post vital signs: Reviewed and stable  Complications: No apparent anesthesia complications

## 2014-05-23 NOTE — Anesthesia Preprocedure Evaluation (Signed)
Anesthesia Evaluation  Patient identified by MRN, date of birth, ID band Patient awake    Reviewed: Allergy & Precautions, H&P , NPO status , Patient's Chart, lab work & pertinent test results  Airway Mallampati: II TM Distance: >3 FB Neck ROM: full    Dental no notable dental hx.    Pulmonary neg pulmonary ROS, former smoker,  breath sounds clear to auscultation  Pulmonary exam normal       Cardiovascular Exercise Tolerance: Good hypertension, negative cardio ROS  Rhythm:regular Rate:Normal     Neuro/Psych  Headaches, negative neurological ROS  negative psych ROS   GI/Hepatic negative GI ROS, Neg liver ROS,   Endo/Other  negative endocrine ROS  Renal/GU negative Renal ROS  negative genitourinary   Musculoskeletal   Abdominal   Peds  Hematology negative hematology ROS (+)   Anesthesia Other Findings   Reproductive/Obstetrics negative OB ROS                           Anesthesia Physical Anesthesia Plan  ASA: II  Anesthesia Plan: General   Post-op Pain Management:    Induction: Intravenous  Airway Management Planned: Oral ETT  Additional Equipment:   Intra-op Plan:   Post-operative Plan: Extubation in OR  Informed Consent: I have reviewed the patients History and Physical, chart, labs and discussed the procedure including the risks, benefits and alternatives for the proposed anesthesia with the patient or authorized representative who has indicated his/her understanding and acceptance.   Dental Advisory Given  Plan Discussed with: CRNA and Surgeon  Anesthesia Plan Comments:         Anesthesia Quick Evaluation

## 2014-05-23 NOTE — Anesthesia Postprocedure Evaluation (Signed)
  Anesthesia Post-op Note  Patient: [name]  Procedure(s) Performed: Procedure(s) (LRB): EXCISION MASS OF LOWER BACK BILATERAL (Bilateral)  Patient Location: PACU  Anesthesia Type: General  Level of Consciousness: awake and alert   Airway and Oxygen Therapy: Patient Spontanous Breathing  Post-op Pain: mild  Post-op Assessment: Post-op Vital signs reviewed, Patient's Cardiovascular Status Stable, Respiratory Function Stable, Patent Airway and No signs of Nausea or vomiting  Last Vitals:  Filed Vitals:   05/23/14 1715  BP:   Pulse: 92  Temp:   Resp: 15    Post-op Vital Signs: stable   Complications: No apparent anesthesia complications

## 2014-05-23 NOTE — Interval H&P Note (Signed)
History and Physical Interval Note:  05/23/2014 3:13 PM  [name]  has presented today for surgery, with the diagnosis of lower back subcutaneous mass   The various methods of treatment have been discussed with the patient and family. After consideration of risks, benefits and other options for treatment, the patient has consented to  Procedure(s): EXCISION MASS ([name]) as a surgical intervention .  The patient's history has been reviewed, patient examined, no change in status, stable for surgery.  I have reviewed the patient's chart and labs.  Questions were answered to the patient's satisfaction.     Blake Vetrano C.

## 2014-05-23 NOTE — H&P (View-Only) (Signed)
Subjective:     Patient ID: Peter Minium, female   DOB: 05-23-1976, 38 y.o.   MRN: [id number]  HPI   Note: This dictation was prepared with Dragon/digital dictation along with [facility] technology. Any transcriptional errors that result from this process are unintentional.       LORENZA SHAKIR  Dec 08, 1976 [id number]  Patient Care Team: Debbrah Alar, NP as PCP - General (Internal Medicine) Bethann Goo Himmelrich, RD as Dietitian (Bariatrics) Mosie Lukes, MD as Consulting Physician (Family Medicine)  Procedure (Date: 02/28/2014):  POST-OPERATIVE DIAGNOSIS: lower back painful subcutaneous masses   PROCEDURE: Procedure(s):  EXCISION OF BILATERAL DEEP SUBCUTANEOUS BACK MASSES  SURGEON: Surgeon(s):  Adin Hector, MD   Diagnosis 1. Soft tissue mass, simple excision, left posterior flank-back - BENIGN ADIPOSE TISSUE CONSISTENT WITH LIPOMA. 2. Soft tissue mass, simple excision, Right posterior flank-back - BENIGN ADIPOSE TISSUE CONSISTENT WITH LIPOMA. [name] MD Pathologist, Electronic Signature (Case signed 03/01/2014) Specimen Adine Heimann and Clinical Information Specimen(s) Obtained: 1. Soft tissue mass, simple excision, left posterior flank-back 2. Soft tissue mass, simple excision, Right posterior flank-back Specimen Clinical Information 1. Lower back subcutaneous masses (tl) Sharnae Winfree 1. The specimen is received in formalin and consists of a 4.3 x 4.1 x 2.1 cm aggregate of tan yellow adipose tissue with a small amount of tan pink fibrous tissue. Representative sections are submitted in one cassette. 2. The specimen is received in formalin and consists of a 5.5 x 4.5 x 2.1 cm aggregate of tan yellow adipose tissue with a small amount of tan pink fibrous tissue. Representative sections are submitted in one cassette. ([name] 02-28-14) Report signed out from the following location(s) Technical Component performed at [facility]. [address],[place] [address].EHUD:[id number]., Interpretation performed at [address], [place], [place] [id number]. CLIA #: [id number],    This patient returns for surgical re-evaluation.  She comes in with her husband.  After the resection of the lipomas on the back, she felt remarkably better immediately.  She had couple of months with no pain.    However, they are concerned a new mass has been found.  Below the left incision on her posterior hip.  Did not notice it until the past few weeks.  Felt soreness first.  Now a mass.  No fevers chills or sweats.  Hoping to travel in the summer so hoping to get this done ASAP.  No fall or trauma.  Does not smoke.  No new lumps or masses anywhere else.  No problems on the right side.  Patient Active Problem List   Diagnosis Date Noted  . Sciatic pain - improving 03/27/2014  . Lipoma of lower back/flank, bilateral, s/p excision 02/24/2014  . Encounter for preconception consultation 02/24/2014  . 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 Sinusitis 11/12/2013  . Anxiety state, unspecified 05/22/2013  . Allergic rhinitis 02/07/2012  . Dyslipidemia 07/28/2011  . Hypertension 01/22/2011    Past Medical History  Diagnosis Date  . Anemia   . Blood in stool   . Chicken pox   . Diverticulitis   . Colon polyps   . Obesity   . Headache(784.0)     migraines  . Fractured toe 09/2013    left big toe about 3 weeks ago  . Seasonal allergies     Stuffy nose  . Hypertension     no problems since 06/2013   . 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  Cesarean section  2006  . Laparoscopic cholecystectomy  2012  . Laparoscopic gastric sleeve resection [name] 06/19/2013    Procedure: LARAPROSCOPIC SLEEVE GASTRECTOMY WITH EGD;  Surgeon: Madilyn Hook, DO;  Location: [facility];  Service: General;  Laterality: [name];  LARAPROSCOPIC SLEEVE GASTRECTOMY WITH EGD   . Esophagogastroduodenoscopy [name]  06/19/2013    Procedure: ESOPHAGOGASTRODUODENOSCOPY (EGD);  Surgeon: Madilyn Hook, DO;  Location: [facility];  Service: General;  Laterality: [name];  . Ear cyst excision [name] 10/10/2013    Procedure:  REMOVAL Scalp masses;  Surgeon: Madilyn Hook, DO;  Location: [facility];  Service: General;  Laterality: [name];  . Mass excision Bilateral 02/28/2014    Procedure: EXCISION OF BILATERAL DEEP SUBCUTANEOUS BACK MASSES;  Surgeon: Adin Hector, MD;  Location: [facility];  Service: General;  Laterality: Bilateral;    History   Social History  . Marital Status: Married    Spouse Name: [name]    Number of Children: [name]  . Years of Education: [name]   Occupational History  . Not on file.   Social History Main Topics  . Smoking status: Former Smoker -- 0.25 packs/day for 2 years    Types: Cigarettes    Quit date: 06/11/1996  . Smokeless tobacco: Never Used  . Alcohol Use: No  . Drug Use: No  . Sexual Activity: Not on file   Other Topics Concern  . Not on file   Social History Narrative  . No narrative on file    Family History  Problem Relation Age of Onset  . Stroke      family hx  . Crohn's disease Father   . Lupus Father   . Ulcerative colitis Father   . Cancer Other     breast    Current Outpatient Prescriptions  Medication Sig Dispense Refill  . Calcium 500 MG CHEW Chew 500 mg by mouth 3 (three) times daily.       . cetirizine (ZYRTEC) 10 MG tablet Take 10 mg by mouth daily.      . Cyanocobalamin (VITAMIN B-12) 1000 MCG SUBL Take 1,000 mcg by mouth every morning.       . fluticasone (FLONASE) 50 MCG/ACT nasal spray Place 2 sprays into both nostrils daily.  16 g  3  . folic acid (FOLVITE) 694 MCG tablet Take 400 mcg by mouth daily.      Marland Kitchen ibuprofen (ADVIL,MOTRIN) 200 MG tablet Take 4 tablets (800 mg total) by mouth every 6 (six) hours as needed for fever, headache, mild pain, moderate pain or cramping.  30 tablet  0  . Multiple Vitamins-Minerals (MULTIVITAL) CHEW Chew 1 each by mouth 2 (two)  times daily.      . Prenatal Multivit-Min-Fe-FA (PRENATAL VITAMINS) 0.8 MG tablet Take 1 tablet by mouth daily.       No current facility-administered medications for this visit.     No Known Allergies  BP 130/76  Pulse 67  Temp(Src) 97.2 F (36.2 C)  Ht 5\' 5"  (1.651 m)  Wt 161 lb (73.029 kg)  BMI 26.79 kg/m2  No results found.   Review of Systems  Constitutional: Negative for fever, chills and diaphoresis.  HENT: Negative for ear pain, sore throat and trouble swallowing.   Eyes: Negative for photophobia and visual disturbance.  Respiratory: Negative for cough and choking.   Cardiovascular: Negative for chest pain and palpitations.  Gastrointestinal: Negative for nausea, vomiting, abdominal pain, diarrhea, constipation, anal bleeding and rectal pain.  Genitourinary: Negative for dysuria, frequency and difficulty  urinating.  Musculoskeletal: Negative for gait problem and myalgias.  Skin: Negative for color change, pallor and rash.  Neurological: Negative for dizziness, speech difficulty, weakness and numbness.  Hematological: Negative for adenopathy.  Psychiatric/Behavioral: Negative for confusion and agitation. The patient is not nervous/anxious.        Objective:   Physical Exam  Constitutional: She is oriented to person, place, and time. She appears well-developed and well-nourished. No distress.  HENT:  Head: Normocephalic.  Mouth/Throat: Oropharynx is clear and moist. No oropharyngeal exudate.  Eyes: Conjunctivae and EOM are normal. Pupils are equal, round, and reactive to light. No scleral icterus.  Neck: Normal range of motion. No tracheal deviation present.  Cardiovascular: Normal rate and intact distal pulses.   Pulmonary/Chest: Effort normal. No respiratory distress. She exhibits no tenderness.  Abdominal: Soft. She exhibits no distension. There is no tenderness. Hernia confirmed negative in the right inguinal area and confirmed negative in the left inguinal  area.  Incisions clean with normal healing ridges.  No hernias  Genitourinary: No vaginal discharge found.  Musculoskeletal: Normal range of motion. She exhibits no tenderness.       Back:  Lymphadenopathy:       Right: No inguinal adenopathy present.       Left: No inguinal adenopathy present.  Neurological: She is alert and oriented to person, place, and time. No cranial nerve deficit. She exhibits normal muscle tone. Coordination normal.  Skin: Skin is warm and dry. No rash noted. She is not diaphoretic.  Psychiatric: She has a normal mood and affect. Her behavior is normal.       Assessment:     Recovering well status post removal of lumbar subcutaneous back masses consistent with lipomas.    New left lower back mass, suspicious for new lipoma     Plan:     I am glad that her preop radiating nerve pain faded away, but I am sorry that she has a new mass.  I am skeptical that was there perioperatively.  Reasonable to remove it vs. Following up in 3 months.  She wishes to remove it before it becomes more painful as the last time she was on a lot of pain by the time I became involved:  The pathophysiology of skin & subcutaneous masses was discussed.  Natural history risks without surgery were discussed.  I recommended surgery to remove the mass.  I explained the technique of removal with use of local anesthesia & possible need for more aggressive sedation/anesthesia for patient comfort.    Risks such as bleeding, infection, heart attack, death, and other risks were discussed.  I noted a good likelihood this will help address the problem.   Possibility that this will not correct all symptoms was explained. Possibility of regrowth/recurrence of the mass was discussed.  We will work to minimize complications. Questions were answered.  The patient expresses understanding & wishes to proceed with surgery.

## 2014-05-23 NOTE — Op Note (Signed)
05/23/2014  4:52 PM  [name]:  [name]  38 y.o. female  [name] Care Team: Debbrah Alar, NP as PCP - General (Internal Medicine) Bethann Goo Himmelrich, RD as Dietitian (Bariatrics) Mosie Lukes, MD as Consulting Physician (Family Medicine)  PRE-OPERATIVE DIAGNOSIS:  lower back subcutaneous mass   POST-OPERATIVE DIAGNOSIS:  lower back subcutaneous masses/bilateral  PROCEDURE:  Procedure(s): EXCISION MASSES OF LOWER BACK BILATERAL  SURGEON:  Surgeon(s): Adin Hector, MD  ASSISTANT: RN   ANESTHESIA:   local and general  EBL:     Delay start of Pharmacological VTE agent (>24hrs) due to surgical blood loss or risk of bleeding:  no  DRAINS: none   SPECIMEN:  Source of Specimen:  Lower back masses (bilateral) ?lipomas  DISPOSITION OF SPECIMEN:  PATHOLOGY  COUNTS:  YES  PLAN OF CARE: Discharge to home after PACU  [name] DISPOSITION:  PACU - hemodynamically stable.  INDICATION: [name] with history of painful nodules over lying Rachel posterior iliac spine of lumbar back.  I excised nodules bilaterally a few months ago.  Pathology consistent with lipomas.  [name]'s pain resolved and felt better.  However, she feels new nodules inferior to Rachel prior excision location closer to Rachel upper fold of Rachel buttocks.  She wishes to be aggressive and remove these before they become more symptomatic as they are already tender.  Discussed with [name]:  Rachel pathophysiology of skin & subcutaneous masses was discussed.  Natural history risks without surgery were discussed.  I recommended surgery to remove Rachel mass.  I explained Rachel technique of removal with use of local anesthesia & possible need for more aggressive sedation/anesthesia for [name] comfort.    Risks such as bleeding, infection, heart attack, death, and other risks were discussed.  I noted a good likelihood this will help address Rachel problem.   Possibility that this will not correct all symptoms was  explained. Possibility of regrowth/recurrence of Rachel mass was discussed.  We will work to minimize complications. Questions were answered.  [name] expresses understanding & wishes to proceed with surgery.  OR FINDINGS: Armandina Gemma lipomatous masses was some fibrous changes inferior to posterior iliac spine on Rachel ileum periosteum in Rachel lower lumbar region.  Some fibrous scarring.  Prior excision location smooth without definite evidence of recurrence.  DESCRIPTION: Informed consent was confirmed.  [name] underwent general anesthesia without difficulty.  She was positioned prone.  Her lower back and buttock region was prepped and draped in a sterile fashion.  Surgical timeout confirmed our plan.  I can feel nodules in bilateral oral lower lumbar regions.  There about 3 cm inferior to Rachel prior incisions.  I therefore made new incisions.  I started on Rachel left side since that was Rachel larger more symptomatic nodule.  I did a transverse incision x3 cm.  Sizers subcutaneous tissues.  I found some fibrous firm fatty nodules densely adherent to Rachel surrounding subcutaneous and deep fat.  I excised this and mass.  Turned around until I came to yellow fat that was sophomore considers with Rachel gluteal and flank fat.  I skeletonized all fat off Rachel posterior iliac spine and posterior iliac until there was no evidence of a fibrous or fatty material.  I turned attention towards Rachel right lower lumbar spine.  I made a similar incision and did similar dissection for similar findings..  I ensured hemostasis.   I did an aggressive field block of local anesthetic on Rachel periosteum and subcutaneous tissues.  I  closed Rachel wound using 2-0 Vicryl interrupted deep dermal stitches and then 4-0 Monocryl subcuticular stitch.  Steri-Strips were applied.  Sterile dressings applied. [name] is going to recovery room.  I am about to discuss operative findings with [name]'s family.

## 2014-05-27 ENCOUNTER — Telehealth (INDEPENDENT_AMBULATORY_CARE_PROVIDER_SITE_OTHER): Payer: Self-pay | Admitting: General Surgery

## 2014-05-27 NOTE — Telephone Encounter (Signed)
LMOm asking pt to return my call.  This is so that I may check up on her s/p her surgery.  I also wanted to let her know that her pathology came back as a benign lipoma.

## 2014-05-27 NOTE — Telephone Encounter (Signed)
Patient informed of path benign lipoma. She states she does not have any energy but otherwise doing good

## 2014-05-27 NOTE — Telephone Encounter (Signed)
Message copied by Flossie Buffy on Mon May 27, 2014  2:17 PM ------      Message from: Adin Hector      Created: Mon May 27, 2014  1:31 PM       Pathology shows benign results: Lipoma              Alisha, CCS MA, please call on the patient to make sure recovery is going well & tell pt the good news on the pathology.  Thanks,            Adin Hector, M.D., F.A.C.S.      Gastrointestinal and Minimally Invasive Surgery      [facility]      [address], [place]      [place], [place] [address]      [phone] Main / Paging             ------

## 2014-05-28 ENCOUNTER — Encounter (HOSPITAL_COMMUNITY): Payer: Self-pay | Admitting: Surgery

## 2014-06-03 ENCOUNTER — Telehealth (INDEPENDENT_AMBULATORY_CARE_PROVIDER_SITE_OTHER): Payer: Self-pay

## 2014-06-03 ENCOUNTER — Other Ambulatory Visit (INDEPENDENT_AMBULATORY_CARE_PROVIDER_SITE_OTHER): Payer: Self-pay

## 2014-06-03 MED ORDER — FLUCONAZOLE 200 MG PO TABS: 200.0000 mg | ORAL_TABLET | Freq: Every day | ORAL | Status: DC

## 2014-06-03 NOTE — Telephone Encounter (Signed)
Informed pt that we have sent a Rx for fluconazole to her pharmacy. Pt verbalized understanding.

## 2014-06-03 NOTE — Telephone Encounter (Signed)
Ok fluconazole 200mg  x3 day #3, refill = 5

## 2014-06-03 NOTE — Telephone Encounter (Signed)
Pt is calling today to see if Dr Johney Maine can call her in [place]. Pt states that she has a yeast infection, possibly from abx in hospital. Pt states that she has tried OTC items with no relief. Informed pt I would send Dr Johney Maine a message and we would contact her as soon as we receive a response. Pt verbalizes understanding and agrees with POC.

## 2014-06-12 ENCOUNTER — Encounter (INDEPENDENT_AMBULATORY_CARE_PROVIDER_SITE_OTHER): Payer: Self-pay | Admitting: Surgery

## 2014-06-12 ENCOUNTER — Ambulatory Visit (INDEPENDENT_AMBULATORY_CARE_PROVIDER_SITE_OTHER): Payer: BC Managed Care – PPO | Admitting: Surgery

## 2014-06-12 VITALS — BP 110/70 | HR 63 | Temp 98.0°F | Resp 14 | Ht 65.0 in | Wt 159.8 lb

## 2014-06-12 DIAGNOSIS — D171 Benign lipomatous neoplasm of skin and subcutaneous tissue of trunk: Secondary | ICD-10-CM

## 2014-06-12 DIAGNOSIS — D1739 Benign lipomatous neoplasm of skin and subcutaneous tissue of other sites: Secondary | ICD-10-CM

## 2014-06-12 NOTE — Progress Notes (Signed)
Subjective:     Patient ID: [name], female   DOB: [birth year], 38 y.o.   MRN: [id number]  HPI   Note: This dictation was prepared with Dragon/digital dictation along with [facility] technology. Any transcriptional errors that result from this process are unintentional.       [name]  1975/12/19 [id number]  Patient Care Team: Rachel Alar, NP as PCP - General (Internal Medicine) [name], [name] as Dietitian (Bariatrics) Rachel Lukes, MD as Consulting Physician (Family Medicine)  Procedure (Date: 02/28/2014):  POST-OPERATIVE DIAGNOSIS: lower back painful subcutaneous masses   PROCEDURE: Procedure(s):  EXCISION OF BILATERAL DEEP SUBCUTANEOUS BACK MASSES  SURGEON: Surgeon(s):  Rachel Hector, MD   Diagnosis 1. Soft tissue mass, simple excision, left posterior flank-back - BENIGN ADIPOSE TISSUE CONSISTENT WITH LIPOMA. 2. Soft tissue mass, simple excision, Right posterior flank-back - BENIGN ADIPOSE TISSUE CONSISTENT WITH LIPOMA. Rachel Laws MD Pathologist, Electronic Signature (Case signed 03/01/2014) Specimen [name] and Clinical Information Specimen(s) Obtained: 1. Soft tissue mass, simple excision, left posterior flank-back 2. Soft tissue mass, simple excision, Right posterior flank-back Specimen Clinical Information 1. Lower back subcutaneous masses (tl) [name] 1. The specimen is received in formalin and consists of a 4.3 x 4.1 x 2.1 cm aggregate of tan yellow adipose tissue with a small amount of tan pink fibrous tissue. Representative sections are submitted in one cassette. 2. The specimen is received in formalin and consists of a 5.5 x 4.5 x 2.1 cm aggregate of tan yellow adipose tissue with a small amount of tan pink fibrous tissue. Representative sections are submitted in one cassette. ([name] 02-28-14) Report signed out from the following location(s) Technical Component performed at [facility]. Rachel  [name],[address],[place] [address].QION:[id number]., Interpretation performed at [place], [place], [place] [address]. CLIA #: [id number],  Procedure (Date: 05/23/2014):  POST-OPERATIVE DIAGNOSIS: lower back subcutaneous masses/bilateral   PROCEDURE: Procedure(s):  EXCISION MASSES OF LOWER BACK BILATERAL   SURGEON: Surgeon(s):  Rachel Hector, MD   Diagnosis 1. Soft tissue, lipoma, left lower back - MATURE ADIPOSE, CONSISTENT WITH LIPOMA. 2. Soft tissue, lipoma, right lower back - MATURE ADIPOSE, CONSISTENT WITH LIPOMA. Rachel Males MD Pathologist, Electronic Signature  This patient returns for surgical re-evaluation.  She comes in with her daughters.  After the 1st resection of the lipomas on the back, she felt remarkably better immediately.  She had couple of months with no pain.   However, they are concerned a new mass has been found.  Below the left incision on her posterior hip.  Did not notice it until the past few weeks.  Felt soreness first.  Now a mass on the right as well. I offered removal.  She started feeling a lump on the other side.  Since removal, she feels well.  No pain.  No lumps.  No fevers or chills.  In good spirits.  Patient Active Problem List   Diagnosis Date Noted  . Mass on back - 3x2x2cm 05/15/2014  . Sciatic pain - improving 03/27/2014  . Lipoma of lower back/flank, bilateral, s/p excision 02/24/2014  . 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 Sinusitis 11/12/2013  . Anxiety state, unspecified 05/22/2013  . Allergic rhinitis 02/07/2012  . Dyslipidemia 07/28/2011  . Hypertension 01/22/2011    Past Medical History  Diagnosis Date  . Blood in stool     resolved  . Chicken pox   . Diverticulitis     pt states not sure if had diverticulitis -  she was diagnosed with gallbladder problem  . Colon polyps   . Obesity     resolved - had gastric sleeve surgery  . Fractured toe 09/2013    left big toe about 3 weeks ago  .  Seasonal allergies     Stuffy nose  . Hypertension     no problems since 06/2013   . 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 Headache(784.0)     migraines--none in past 20 yrs  . Anemia     after child birth - no problems since    Past Surgical History  Procedure Laterality Date  . Cesarean section  2006  . Laparoscopic cholecystectomy  2012  . Laparoscopic gastric sleeve resection [name] 06/19/2013    Procedure: LARAPROSCOPIC SLEEVE GASTRECTOMY WITH EGD;  Surgeon: Madilyn Hook, DO;  Location: [facility];  Service: General;  Laterality: [name];  LARAPROSCOPIC SLEEVE GASTRECTOMY WITH EGD   . Esophagogastroduodenoscopy [name] 06/19/2013    Procedure: ESOPHAGOGASTRODUODENOSCOPY (EGD);  Surgeon: Madilyn Hook, DO;  Location: [facility];  Service: General;  Laterality: [name];  . Ear cyst excision [name] 10/10/2013    Procedure:  REMOVAL Scalp masses;  Surgeon: Madilyn Hook, DO;  Location: [facility];  Service: General;  Laterality: [name];  . Mass excision Bilateral 02/28/2014    Procedure: EXCISION OF BILATERAL DEEP SUBCUTANEOUS BACK MASSES;  Surgeon: Rachel Hector, MD;  Location: [facility];  Service: General;  Laterality: Bilateral;  . Mass excision Bilateral 05/23/2014    Procedure: EXCISION MASS OF LOWER BACK BILATERAL;  Surgeon: Rachel Hector, MD;  Location: [facility];  Service: General;  Laterality: Bilateral;    History   Social History  . Marital Status: Married    Spouse Name: [name]    Number of Children: [name]  . Years of Education: [name]   Occupational History  . Not on file.   Social History Main Topics  . Smoking status: Former Smoker -- 0.25 packs/day for 2 years    Types: Cigarettes    Quit date: 06/11/1996  . Smokeless tobacco: Never Used  . Alcohol Use: No  . Drug Use: No  . Sexual Activity: Not on file   Other Topics Concern  . Not on file   Social History Narrative  . No narrative on file    Family History  Problem Relation Age of Onset  . Stroke      family hx  . Crohn's disease Father   . Lupus  Father   . Ulcerative colitis Father   . Cancer Other     breast    Current Outpatient Prescriptions  Medication Sig Dispense Refill  . Calcium 500 MG CHEW Chew 500 mg by mouth 3 (three) times daily.       . cetirizine (ZYRTEC) 10 MG tablet Take 10 mg by mouth daily as needed.       . Cyanocobalamin (VITAMIN B-12) 1000 MCG SUBL Take 1,000 mcg by mouth every morning.       . fluconazole (DIFLUCAN) 200 MG tablet Take 1 tablet (200 mg total) by mouth daily.  3 tablet  5  . fluticasone (FLONASE) 50 MCG/ACT nasal spray Place 2 sprays into both nostrils daily as needed.      . folic acid (FOLVITE) 749 MCG tablet Take 400 mcg by mouth daily.      Marland Kitchen ibuprofen (ADVIL,MOTRIN) 200 MG tablet Take 4 tablets (800 mg total) by mouth every 6 (six) hours as needed for fever, headache, mild pain, moderate pain or cramping.  [place]  tablet  0  . Multiple Vitamins-Minerals (MULTIVITAL) CHEW Chew 1 each by mouth 2 (two) times daily.      . Prenatal Multivit-Min-Fe-FA (PRENATAL VITAMINS) 0.8 MG tablet Take 1 tablet by mouth daily.       No current facility-administered medications for this visit.     No Known Allergies  BP 110/70  Pulse 63  Temp(Src) 98 F (36.7 C)  Resp 14  Ht 5\' 5"  (1.651 m)  Wt 159 lb 12.8 oz (72.485 kg)  BMI 26.59 kg/m2  LMP 05/13/2014  No results found.   Review of Systems  Constitutional: Negative for fever, chills and diaphoresis.  HENT: Negative for ear pain, sore throat and trouble swallowing.   Eyes: Negative for photophobia and visual disturbance.  Respiratory: Negative for cough and choking.   Cardiovascular: Negative for chest pain and palpitations.  Gastrointestinal: Negative for nausea, vomiting, abdominal pain, diarrhea, constipation, anal bleeding and rectal pain.  Genitourinary: Negative for dysuria, frequency and difficulty urinating.  Musculoskeletal: Negative for gait problem and myalgias.  Skin: Negative for color change, pallor and rash.  Neurological:  Negative for dizziness, speech difficulty, weakness and numbness.  Hematological: Negative for adenopathy.  Psychiatric/Behavioral: Negative for confusion and agitation. The patient is not nervous/anxious.        Objective:   Physical Exam  Constitutional: She is oriented to person, place, and time. She appears well-developed and well-nourished. No distress.  HENT:  Head: Normocephalic.  Mouth/Throat: Oropharynx is clear and moist. No oropharyngeal exudate.  Eyes: Conjunctivae and EOM are normal. Pupils are equal, round, and reactive to light. No scleral icterus.  Neck: Normal range of motion. No tracheal deviation present.  Cardiovascular: Normal rate and intact distal pulses.   Pulmonary/Chest: Effort normal. No respiratory distress. She exhibits no tenderness.  Abdominal: Soft. She exhibits no distension. There is no tenderness. Hernia confirmed negative in the right inguinal area and confirmed negative in the left inguinal area.  Incisions clean with normal healing ridges.  No hernias  Genitourinary: No vaginal discharge found.  Musculoskeletal: Normal range of motion. She exhibits no tenderness.       Back:  Lymphadenopathy:       Right: No inguinal adenopathy present.       Left: No inguinal adenopathy present.  Neurological: She is alert and oriented to person, place, and time. No cranial nerve deficit. She exhibits normal muscle tone. Coordination normal.  Skin: Skin is warm and dry. No rash noted. She is not diaphoretic.  Psychiatric: She has a normal mood and affect. Her behavior is normal.       Assessment:     Recovering well status post removal of new lumbar subcutaneous back masses consistent with lipomas.       Plan:     I am hopeful this is the end of the recurrent masses.  He will see.  If recurrent, consider more aggressive evaluation to be sure we are not missing anything.  Can offer another opinion.  She is comfortable with using me as her resource.   Hopefully no new masses.  Increase activity as tolerated to regular activity.  Low impact exercise such as walking an hour a day at least ideal.  Do not push through pain.  Diet as tolerated.  Low fat high fiber diet ideal.  Bowel regimen with 30 g fiber a day and fiber supplement as needed to avoid problems.  Return to clinic as needed.   Instructions discussed.  Followup with primary care physician  for other health issues as would normally be done.  Consider screening for malignancies (breast, prostate, colon, melanoma, etc) as appropriate.  Questions answered.  The patient expressed understanding and appreciation

## 2014-06-12 NOTE — Patient Instructions (Signed)
GENERAL SURGERY: POST OP INSTRUCTIONS  1. DIET: Follow a light bland diet the first 24 hours after arrival home, such as soup, liquids, crackers, etc.  Be sure to include lots of fluids daily.  Avoid fast food or heavy meals as your are more likely to get nauseated.   2. Take your usually prescribed home medications unless otherwise directed. 3. PAIN CONTROL: a. Pain is best controlled by a usual combination of three different methods TOGETHER: i. Ice/Heat ii. Over the counter pain medication iii. Prescription pain medication b. Most patients will experience some swelling and bruising around the incisions.  Ice packs or heating pads (30-60 minutes up to 6 times a day) will help. Use ice for the first few days to help decrease swelling and bruising, then switch to heat to help relax tight/sore spots and speed recovery.  Some people prefer to use ice alone, heat alone, alternating between ice & heat.  Experiment to what works for you.  Swelling and bruising can take several weeks to resolve.   c. It is helpful to take an over-the-counter pain medication regularly for the first few weeks.  Choose one of the following that works best for you: i. Naproxen (Aleve, etc)  Two 274m tabs twice a day ii. Ibuprofen (Advil, etc) Three 2085mtabs four times a day (every meal & bedtime) iii. Acetaminophen (Tylenol, etc) 500-65021mour times a day (every meal & bedtime) d. A  prescription for pain medication (such as oxycodone, hydrocodone, etc) should be given to you upon discharge.  Take your pain medication as prescribed.  i. If you are having problems/concerns with the prescription medicine (does not control pain, nausea, vomiting, rash, itching, etc), please call us [phone] see if we need to switch you to a different pain medicine that will work better for you and/or control your side effect better. ii. If you need a refill on your pain medication, please contact your pharmacy.  They will contact our  office to request authorization. Prescriptions will not be filled after 5 pm or on week-ends. 4. Avoid getting constipated.  Between the surgery and the pain medications, it is common to experience some constipation.  Increasing fluid intake and taking a fiber supplement (such as Metamucil, Citrucel, FiberCon, MiraLax, etc) 1-2 times a day regularly will usually help prevent this problem from occurring.  A mild laxative (prune juice, Milk of Magnesia, MiraLax, etc) should be taken according to package directions if there are no bowel movements after 48 hours.   5. Wash / shower every day.  You may shower over the dressings as they are waterproof.  Continue to shower over incision(s) after the dressing is off. 6. Remove your waterproof bandages 5 days after surgery.  You may leave the incision open to air.  You may have skin tapes (Steri Strips) covering the incision(s).  Leave them on until one week, then remove.  You may replace a dressing/Band-Aid to cover the incision for comfort if you wish.      7. ACTIVITIES as tolerated:   a. You may resume regular (light) daily activities beginning the next day-such as daily self-care, walking, climbing stairs-gradually increasing activities as tolerated.  If you can walk 30 minutes without difficulty, it is safe to try more intense activity such as jogging, treadmill, bicycling, low-impact aerobics, swimming, etc. b. Save the most intensive and strenuous activity for last such as sit-ups, heavy lifting, contact sports, etc  Refrain from any heavy lifting or straining until you  are off narcotics for pain control.   c. DO NOT PUSH THROUGH PAIN.  Let pain be your guide: If it hurts to do something, don't do it.  Pain is your body warning you to avoid that activity for another week until the pain goes down. d. You may drive when you are no longer taking prescription pain medication, you can comfortably wear a seatbelt, and you can safely maneuver your car and apply  brakes. e. Dennis Bast may have sexual intercourse when it is comfortable.  8. FOLLOW UP in our office a. Please call [facility] at [phone] to set up an appointment to see your surgeon in the office for a follow-up appointment approximately 2-3 weeks after your surgery. b. Make sure that you call for this appointment the day you arrive home to insure a convenient appointment time. 9. IF YOU HAVE DISABILITY OR FAMILY LEAVE FORMS, BRING THEM TO THE OFFICE FOR PROCESSING.  DO NOT GIVE THEM TO YOUR DOCTOR.   WHEN TO CALL us [phone]: 1. Poor pain control 2. Reactions / problems with new medications (rash/itching, nausea, etc)  3. Fever over 101.5 F (38.5 C) 4. Worsening swelling or bruising 5. Continued bleeding from incision. 6. Increased pain, redness, or drainage from the incision 7. Difficulty breathing / swallowing   The clinic staff is available to answer your questions during regular business hours (8:30am-5pm).  Please don't hesitate to call and ask to speak to one of our nurses for clinical concerns.   If you have a medical emergency, go to the nearest emergency room or call 911.  A surgeon from [facility] is always on call at the [facility], [place], [place], [place], [place]  [address] ? MAIN: [phone] ? TOLL FREE: [phone] ?  FAX [phone] [email or web address]  Lipoma A lipoma is a noncancerous (benign) tumor composed of fat cells. They are usually found under the skin (subcutaneous). A lipoma may occur in any tissue of the body that contains fat. Common areas for lipomas to appear include the back, shoulders, buttocks, and thighs. Lipomas are a very common soft tissue growth. They are soft and grow slowly. Most problems caused by a lipoma depend on where it is growing. DIAGNOSIS  A lipoma can be diagnosed with a physical exam. These tumors rarely become cancerous, but radiographic studies can help  determine this for certain. Studies used may include:  Computerized X-ray scans (CT or CAT scan).  Computerized magnetic scans (MRI). TREATMENT  Small lipomas that are not causing problems may be watched. If a lipoma continues to enlarge or causes problems, removal is often the best treatment. Lipomas can also be removed to improve appearance. Surgery is done to remove the fatty cells and the surrounding capsule. Most often, this is done with medicine that numbs the area (local anesthetic). The removed tissue is examined under a microscope to make sure it is not cancerous. Keep all follow-up appointments with your caregiver. SEEK MEDICAL CARE IF:   The lipoma becomes larger or hard.  The lipoma becomes painful, red, or increasingly swollen. These could be signs of infection or a more serious condition. Document Released: 11/19/2002 Document Revised: 02/21/2012 Document Reviewed: 05/01/2010 [facility] Patient Information 2015 [place], [place]. This information is not intended to replace advice given to you by your health care provider. Make sure you discuss any questions you have with your health care provider.

## 2014-11-01 ENCOUNTER — Encounter: Payer: Self-pay | Admitting: Family

## 2014-11-01 ENCOUNTER — Ambulatory Visit (INDEPENDENT_AMBULATORY_CARE_PROVIDER_SITE_OTHER): Payer: BC Managed Care – PPO | Admitting: Family

## 2014-11-01 VITALS — BP 128/67 | HR 79 | Temp 98.0°F | Resp 16 | Ht 65.5 in | Wt 165.0 lb

## 2014-11-01 DIAGNOSIS — J069 Acute upper respiratory infection, unspecified: Secondary | ICD-10-CM

## 2014-11-01 LAB — POCT RAPID STREP A (OFFICE): Rapid Strep A Screen: NEGATIVE

## 2014-11-01 NOTE — Patient Instructions (Signed)
You may use tylenol as needed for comfort.  Honey lemon cough drops as needed for cough.  Call is symptoms worsen or if not resolved in 1 week.

## 2014-11-01 NOTE — Progress Notes (Signed)
Subjective:    Patient ID: [name], female    DOB: 26-Feb-1976, 38 y.o.   MRN: [id number]  HPI  [name] is a 38 yr old female who presents today  With complaint of sore throat x 2 days. Moderate sore throat. Worse today then yesterday. Took tylenol, honey lemon halls. Some improvement in her symptoms. Reports associated nasal congestion which is relieved by nasal saline spray.  She reports that husband was sick with cold recently.   Review of Systems See HPI  Past Medical History  Diagnosis Date  . Blood in stool     resolved  . Chicken pox   . Diverticulitis     pt states not sure if had diverticulitis - she was diagnosed with gallbladder problem  . Colon polyps   . Obesity     resolved - had gastric sleeve surgery  . Fractured toe 09/2013    left big toe about 3 weeks ago  . Seasonal allergies     Stuffy nose  . Hypertension     no problems since 06/2013   . 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 Headache(784.0)     migraines--none in past 20 yrs  . Anemia     after child birth - no problems since    History   Social History  . Marital Status: Married    Spouse Name: [name]    Number of Children: [name]  . Years of Education: [name]   Occupational History  . Not on file.   Social History Main Topics  . Smoking status: Former Smoker -- 0.25 packs/day for 2 years    Types: Cigarettes    Quit date: 06/11/1996  . Smokeless tobacco: Never Used  . Alcohol Use: No  . Drug Use: No  . Sexual Activity: Not on file   Other Topics Concern  . Not on file   Social History Narrative    Past Surgical History  Procedure Laterality Date  . Cesarean section  2006  . Laparoscopic cholecystectomy  2012  . Laparoscopic gastric sleeve resection [name] 06/19/2013    Procedure: LARAPROSCOPIC SLEEVE GASTRECTOMY WITH EGD;  Surgeon: Madilyn Hook, DO;  Location: [facility];  Service: General;  Laterality: [name];  LARAPROSCOPIC SLEEVE GASTRECTOMY WITH EGD   . Esophagogastroduodenoscopy [name] 06/19/2013    Procedure: ESOPHAGOGASTRODUODENOSCOPY (EGD);  Surgeon: Madilyn Hook, DO;  Location: [facility];  Service: General;  Laterality: [name];  . Ear cyst excision [name] 10/10/2013    Procedure:  REMOVAL Scalp masses;  Surgeon: Madilyn Hook, DO;  Location: [facility];  Service: General;  Laterality: [name];  . Mass excision Bilateral 02/28/2014    Procedure: EXCISION OF BILATERAL DEEP SUBCUTANEOUS BACK MASSES;  Surgeon: Adin Hector, MD;  Location: [facility];  Service: General;  Laterality: Bilateral;  . Mass excision Bilateral 05/23/2014    Procedure: EXCISION MASS OF LOWER BACK BILATERAL;  Surgeon: Adin Hector, MD;  Location: [facility];  Service: General;  Laterality: Bilateral;    Family History  Problem Relation Age of Onset  . Stroke      family hx  . Crohn's disease Father   . Lupus Father   . Ulcerative colitis Father   . Cancer Other     breast    No Known Allergies  Current Outpatient Prescriptions on File Prior to Visit  Medication Sig Dispense Refill  . Calcium 500 MG CHEW Chew 500 mg by mouth daily.     . Cyanocobalamin (VITAMIN B-12) 1000 MCG SUBL Take 1,000  mcg by mouth every morning.     . folic acid (FOLVITE) 811 MCG tablet Take 400 mcg by mouth daily.    . Prenatal Multivit-Min-Fe-FA (PRENATAL VITAMINS) 0.8 MG tablet Take 1 tablet by mouth daily.     No current facility-administered medications on file prior to visit.    BP 128/67 mmHg  Pulse 79  Temp(Src) 98 F (36.7 C) (Oral)  Resp 16  Ht 5' 5.5" (1.664 m)  Wt 165 lb (74.844 kg)  BMI 27.03 kg/m2  SpO2 100%  LMP 05/13/2014       Objective:   Physical Exam  Constitutional: She is oriented to person, place, and time. She appears well-developed and well-nourished. No distress.  HENT:  Head: Normocephalic and atraumatic.  Very mild oropharyngeal erythema without exudate  Cardiovascular: Normal rate and regular rhythm.   No murmur heard. Pulmonary/Chest: Effort normal and breath sounds normal. No respiratory distress. She  has no wheezes. She has no rales. She exhibits no tenderness.  Musculoskeletal: She exhibits no edema.  Lymphadenopathy:    She has no cervical adenopathy.  Neurological: She is alert and oriented to person, place, and time.  Skin: Skin is warm.  Psychiatric: She has a normal mood and affect. Her behavior is normal. Judgment and thought content normal.          Assessment & Plan:  URI- rapid strep neg. Symptoms most consistent with viral URI. Advised supportive measures/follow up as outlined in AVS.

## 2015-01-02 ENCOUNTER — Ambulatory Visit (INDEPENDENT_AMBULATORY_CARE_PROVIDER_SITE_OTHER): Payer: BLUE CROSS/BLUE SHIELD | Admitting: Medical

## 2015-01-02 ENCOUNTER — Encounter: Payer: Self-pay | Admitting: Medical

## 2015-01-02 DIAGNOSIS — J029 Acute pharyngitis, unspecified: Secondary | ICD-10-CM

## 2015-01-02 DIAGNOSIS — B9789 Other viral agents as the cause of diseases classified elsewhere: Secondary | ICD-10-CM

## 2015-01-02 DIAGNOSIS — J028 Acute pharyngitis due to other specified organisms: Principal | ICD-10-CM

## 2015-01-02 NOTE — Assessment & Plan Note (Signed)
Your throat exam and clinical presentation now is not real suspicious for strep. Rapid test is negative and symptoms may represent only viral pharyngitis. Therefore I do no want to rx antibiotic without need. I am going to send out throat culture during the interim. If your symptoms worsen or change before send out culture then I would ask that you contact your gyn/ob. We will call you wend send out culture is back.

## 2015-01-02 NOTE — Patient Instructions (Addendum)
Your throat exam and clinical presentation now is not real suspicious for strep. Rapid test is negative and symptoms may represent only viral pharyngitis. Therefore I do no want to rx antibiotic without need. I am going to send out throat culture during the interim. If your symptoms worsen or change before send out culture then I would ask that you contact your gyn/ob. We will call you wend send out culture is back.  Continue conservative measures approved by gyn.  If any pregnancy related complaints then contact gyn. Any severe pregnancy related complaints over long weekend then ED eval.  Follow up 7 days or as needed.

## 2015-01-02 NOTE — Progress Notes (Signed)
Pre visit review using our clinic review tool, if applicable. No additional management support is needed unless otherwise documented below in the visit note. 

## 2015-01-02 NOTE — Progress Notes (Signed)
Subjective:    Patient ID: [name], female    DOB: May 03, 1976, 39 y.o.   MRN: [id number]  HPI   Pt in for sorethroat. Pt also has nasal congestion. Pt is [redacted] weeks pregnant. Pt has no know exposure to strep. Pt kids are not sick. No fevers and no chills. No skin rash. No diffuse bodyaches. No known strep exposure.   Pt [facility]ian is [facility]. Ob  [phone].  Pt has no vaginal bleeding  and no cramps.   Faint nasal congestion.  Yesterday st began. Last night was moderate. Today is less.   Pt only taking nasal saline spray. Pt following ob advise. Only can take tylenol.  Past Medical History  Diagnosis Date  . Blood in stool     resolved  . Chicken pox   . Diverticulitis     pt states not sure if had diverticulitis - she was diagnosed with gallbladder problem  . Colon polyps   . Obesity     resolved - had gastric sleeve surgery  . Fractured toe 09/2013    left big toe about 3 weeks ago  . Seasonal allergies     Stuffy nose  . Hypertension     no problems since 06/2013   . 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 Headache(784.0)     migraines--none in past 20 yrs  . Anemia     after child birth - no problems since    History   Social History  . Marital Status: Married    Spouse Name: [name]    Number of Children: [name]  . Years of Education: [name]   Occupational History  . Not on file.   Social History Main Topics  . Smoking status: Former Smoker -- 0.25 packs/day for 2 years    Types: Cigarettes    Quit date: 06/11/1996  . Smokeless tobacco: Never Used  . Alcohol Use: No  . Drug Use: No  . Sexual Activity: Not on file   Other Topics Concern  . Not on file   Social History Narrative    Past Surgical History  Procedure Laterality Date  . Cesarean section  2006  . Laparoscopic cholecystectomy  2012  . Laparoscopic gastric sleeve resection [name] 06/19/2013    Procedure: LARAPROSCOPIC SLEEVE GASTRECTOMY WITH EGD;  Surgeon: Madilyn Hook, DO;   Location: [facility];  Service: General;  Laterality: [name];  LARAPROSCOPIC SLEEVE GASTRECTOMY WITH EGD   . Esophagogastroduodenoscopy [name] 06/19/2013    Procedure: ESOPHAGOGASTRODUODENOSCOPY (EGD);  Surgeon: Madilyn Hook, DO;  Location: [facility];  Service: General;  Laterality: [name];  . Ear cyst excision [name] 10/10/2013    Procedure:  REMOVAL Scalp masses;  Surgeon: Madilyn Hook, DO;  Location: [facility];  Service: General;  Laterality: [name];  . Mass excision Bilateral 02/28/2014    Procedure: EXCISION OF BILATERAL DEEP SUBCUTANEOUS BACK MASSES;  Surgeon: Adin Hector, MD;  Location: [facility];  Service: General;  Laterality: Bilateral;  . Mass excision Bilateral 05/23/2014    Procedure: EXCISION MASS OF LOWER BACK BILATERAL;  Surgeon: Adin Hector, MD;  Location: [facility];  Service: General;  Laterality: Bilateral;    Family History  Problem Relation Age of Onset  . Stroke      family hx  . Crohn's disease Father   . Lupus Father   . Ulcerative colitis Father   . Cancer Other     breast    No Known Allergies  Current Outpatient Prescriptions on File  Prior to Visit  Medication Sig Dispense Refill  . Calcium 500 MG CHEW Chew 500 mg by mouth daily.     . Cyanocobalamin (VITAMIN B-12) 1000 MCG SUBL Take 1,000 mcg by mouth every morning.     . folic acid (FOLVITE) 948 MCG tablet Take 400 mcg by mouth daily.    Marland Kitchen lidocaine (LIDODERM) 5 % Place 1 patch onto the skin at bedtime.    . Prenatal Multivit-Min-Fe-FA (PRENATAL VITAMINS) 0.8 MG tablet Take 1 tablet by mouth daily.     No current facility-administered medications on file prior to visit.    BP 129/80 mmHg  Pulse 105  Temp(Src) 98.3 F (36.8 C) (Oral)  Ht 5' 5.5" (1.664 m)  Wt 176 lb 6.4 oz (80.015 kg)  BMI 28.90 kg/m2  SpO2 99%  LMP 05/13/2014        Review of Systems  Constitutional: Negative for fever, chills and fatigue.  HENT: Positive for sore throat. Negative for ear pain, mouth sores, nosebleeds, postnasal drip,  rhinorrhea, sneezing, tinnitus and trouble swallowing.   Respiratory: Negative for cough, chest tightness, shortness of breath and wheezing.   Cardiovascular: Negative for chest pain and palpitations.  Gastrointestinal: Negative for nausea, vomiting, abdominal pain, diarrhea, blood in stool and rectal pain.  Endocrine: Negative for polydipsia and polyuria.  Genitourinary: Negative for dysuria, urgency, frequency, hematuria, flank pain, vaginal pain and pelvic pain.       No vaginal dc.  Musculoskeletal: Negative for back pain.       No back or abdomen cramps.  Neurological: Negative for dizziness, seizures, syncope, weakness, numbness and headaches.  Hematological: Negative for adenopathy. Does not bruise/bleed easily.  Psychiatric/Behavioral: Negative for behavioral problems and confusion.       Objective:   Physical Exam   General  Mental Status - Alert. General Appearance - Well groomed. Not in acute distress.  Skin Rashes- No Rashes.  HEENT Head- Normal. Ear Auditory Canal - Left- Normal. Right - Normal.Tympanic Membrane- Left- Normal. Right- Normal. Eye Sclera/Conjunctiva- Left- Normal. Right- Normal. Nose & Sinuses Nasal Mucosa- Left-  Not boggy or Congested. Right-  Not  boggy or Congested. No sinus pressure. Mouth & Throat Lips: Upper Lip- Normal: no dryness, cracking, pallor, cyanosis, or vesicular eruption. Lower Lip-Normal: no dryness, cracking, pallor, cyanosis or vesicular eruption. Buccal Mucosa- Bilateral- No Aphthous ulcers. Oropharynx- No Discharge or Erythema. Tonsils: Characteristics- Bilateral- faint  Erythema. Size/Enlargement- Bilateral- No enlargement. Discharge- bilateral-None.  Neck Neck- Supple. No Masses.   Chest and Lung Exam Auscultation: Breath Sounds:- even and unlabored.  Cardiovascular Auscultation:Rythm- Regular, rate and rhythm. Murmurs & Other Heart Sounds:Ausculatation of the heart reveal- No Murmurs.  Lymphatic Head &  Neck General Head & Neck Lymphatics: Bilateral: Description- No Localized lymphadenopathy.           Assessment & Plan:

## 2015-01-04 LAB — CULTURE, GROUP A STREP: Organism ID, Bacteria: NORMAL

## 2015-01-10 LAB — POCT RAPID STREP A (OFFICE): RAPID STREP A SCREEN: NEGATIVE

## 2015-02-11 LAB — HM PAP SMEAR: HM PAP: NORMAL

## 2015-03-18 ENCOUNTER — Other Ambulatory Visit: Payer: Self-pay

## 2015-03-18 DIAGNOSIS — R222 Localized swelling, mass and lump, trunk: Secondary | ICD-10-CM

## 2015-03-29 ENCOUNTER — Ambulatory Visit
Admission: RE | Admit: 2015-03-29 | Discharge: 2015-03-29 | Disposition: A | Discharge status: Home / Self Care | Payer: BLUE CROSS/BLUE SHIELD | Source: Ambulatory Visit | Attending: Surgery | Admitting: Surgery

## 2015-03-29 MED ORDER — GADOBENATE DIMEGLUMINE 529 MG/ML IV SOLN
15.0000 mL | Freq: Once | INTRAVENOUS | Status: AC | PRN
  Administered 2015-03-29: 15 mL via INTRAVENOUS

## 2015-07-10 ENCOUNTER — Ambulatory Visit (INDEPENDENT_AMBULATORY_CARE_PROVIDER_SITE_OTHER): Payer: BLUE CROSS/BLUE SHIELD | Admitting: Medical

## 2015-07-10 ENCOUNTER — Encounter: Payer: Self-pay | Admitting: Medical

## 2015-07-10 VITALS — BP 145/90 | HR 87 | Temp 98.2°F | Ht 65.5 in | Wt 167.2 lb

## 2015-07-10 DIAGNOSIS — R591 Generalized enlarged lymph nodes: Secondary | ICD-10-CM

## 2015-07-10 DIAGNOSIS — L089 Local infection of the skin and subcutaneous tissue, unspecified: Secondary | ICD-10-CM

## 2015-07-10 DIAGNOSIS — H6502 Acute serous otitis media, left ear: Secondary | ICD-10-CM | POA: Diagnosis not present

## 2015-07-10 DIAGNOSIS — R0981 Nasal congestion: Secondary | ICD-10-CM

## 2015-07-10 MED ORDER — AMOXICILLIN-POT CLAVULANATE 875-125 MG PO TABS: 1.0000 | ORAL_TABLET | Freq: Two times a day (BID) | ORAL | Status: DC

## 2015-07-10 MED ORDER — IPRATROPIUM BROMIDE 0.06 % NA SOLN: 2.0000 | Freq: Three times a day (TID) | NASAL | Status: DC

## 2015-07-10 NOTE — Patient Instructions (Addendum)
For  Lt om and skin infection. Rx augmentin.  For the nasal congestion use atrovent nasal spray.  Your left neck lymph nodes should resolve with augmentin.  Follow up 7 days or as needed

## 2015-07-10 NOTE — Progress Notes (Signed)
Pre visit review using our clinic review tool, if applicable. No additional management support is needed unless otherwise documented below in the visit note. 

## 2015-07-10 NOTE — Progress Notes (Signed)
Subjective:    Patient ID: [name], female    DOB: Apr 14, 1976, 39 y.o.   MRN: [id number]  HPI  Pt in with some nasal congestion. Pt son/baby got cold and then pt got nasal congested over past 2 wks. Now some sinus pressure. Her left sub mandiblar node  region hurts. Yesterday some mild transient pain in left ear. Also left nares mild pain and swelling at entrance over past wk. Constant wiping area due to runny nose.  Pt is taking afrin intermittentlyy but not every day. Every 3 days or so use.   Not teeth pain.     Review of Systems  Constitutional: Negative for fever, chills, diaphoresis and fatigue.  HENT: Positive for congestion, ear pain and sinus pressure. Negative for mouth sores and postnasal drip.   Respiratory: Negative for cough, chest tightness, shortness of breath and wheezing.   Cardiovascular: Negative for chest pain and palpitations.  Musculoskeletal: Negative for back pain.  Skin:       Nares skin redness and pain.  Neurological: Negative for dizziness and headaches.  Hematological: Positive for adenopathy. Does not bruise/bleed easily.  Psychiatric/Behavioral: Negative for behavioral problems and confusion.    Past Medical History  Diagnosis Date  . Blood in stool     resolved  . Chicken pox   . Diverticulitis     pt states not sure if had diverticulitis - she was diagnosed with gallbladder problem  . Colon polyps   . Obesity     resolved - had gastric sleeve surgery  . Fractured toe 09/2013    left big toe about 3 weeks ago  . Seasonal allergies     Stuffy nose  . Hypertension     no problems since 06/2013   . 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 Headache(784.0)     migraines--none in past 20 yrs  . Anemia     after child birth - no problems since    History   Social History  . Marital Status: Married    Spouse Name: [name]  . Number of Children: [name]  . Years of Education: [name]   Occupational History  . Not on file.   Social History Main Topics    . Smoking status: Former Smoker -- 0.25 packs/day for 2 years    Types: Cigarettes    Quit date: 06/11/1996  . Smokeless tobacco: Never Used  . Alcohol Use: No  . Drug Use: No  . Sexual Activity: Not on file   Other Topics Concern  . Not on file   Social History Narrative    Past Surgical History  Procedure Laterality Date  . Cesarean section  2006  . Laparoscopic cholecystectomy  2012  . Laparoscopic gastric sleeve resection [name] 06/19/2013    Procedure: LARAPROSCOPIC SLEEVE GASTRECTOMY WITH EGD;  Surgeon: Madilyn Hook, DO;  [facility]: [facility];  Service: General;  Laterality: [name];  LARAPROSCOPIC SLEEVE GASTRECTOMY WITH EGD   . Esophagogastroduodenoscopy [name] 06/19/2013    Procedure: ESOPHAGOGASTRODUODENOSCOPY (EGD);  Surgeon: Madilyn Hook, DO;  [facility]: [facility];  Service: General;  Laterality: [name];  . Ear cyst excision [name] 10/10/2013    Procedure:  REMOVAL Scalp masses;  Surgeon: Madilyn Hook, DO;  Location: [facility];  Service: General;  Laterality: [name];  . Mass excision Bilateral 02/28/2014    Procedure: EXCISION OF BILATERAL DEEP SUBCUTANEOUS BACK MASSES;  Surgeon: Adin Hector, MD;  Location: [facility];  Service: General;  Laterality: Bilateral;  . Mass excision Bilateral 05/23/2014  Procedure: EXCISION MASS OF LOWER BACK BILATERAL;  Surgeon: Adin Hector, MD;  Location: [facility];  Service: General;  Laterality: Bilateral;    Family History  Problem Relation Age of Onset  . Stroke      family hx  . Crohn's disease Father   . Lupus Father   . Ulcerative colitis Father   . Cancer Other     breast    Allergies  Allergen Reactions  . Morphine And Related Nausea Only    With vomiting    Current Outpatient Prescriptions on File Prior to Visit  Medication Sig Dispense Refill  . Prenatal Multivit-Min-Fe-FA (PRENATAL VITAMINS) 0.8 MG tablet Take 1 tablet by mouth daily.    . Calcium 500 MG CHEW Chew 500 mg by mouth daily.     . Cyanocobalamin (VITAMIN B-12) 1000 MCG SUBL Take  1,000 mcg by mouth every morning.     . folic acid (FOLVITE) 295 MCG tablet Take 400 mcg by mouth daily.    Marland Kitchen lidocaine (LIDODERM) 5 % Place 1 patch onto the skin at bedtime.     No current facility-administered medications on file prior to visit.    BP 145/90 mmHg  Pulse 87  Temp(Src) 98.2 F (36.8 C) (Oral)  Ht 5' 5.5" (1.664 m)  Wt 167 lb 3.2 oz (75.841 kg)  BMI 27.39 kg/m2  SpO2 97%  LMP   Breastfeeding? Unknown       Objective:   Physical Exam   General  Mental Status - Alert. General Appearance - Well groomed. Not in acute distress.  Skin Rashes- No Rashes.  HEENT Head- Normal. Ear Auditory Canal - Left- Normal. Right - Normal.Tympanic Membrane- Left- mild central rednessRight- mild central Eye Sclera/Conjunctiva- Left- Normal. Right- Normal. Nose & Sinuses Nasal Mucosa- Left-  Mild boggy + Congested. Right-  Mild   boggy + Congested. No sinus pressure. Left nares medial aspect. Red swollen and tender. Mouth & Throat Lips: Upper Lip- Normal: no dryness, cracking, pallor, cyanosis, or vesicular eruption. Lower Lip-Normal: no dryness, cracking, pallor, cyanosis or vesicular eruption. Buccal Mucosa- Bilateral- No Aphthous ulcers. Oropharynx- No Discharge or Erythema. Tonsils: Characteristics- Bilateral- No Erythema or Congestion. Size/Enlargement- Bilateral- No enlargement. Discharge- bilateral-None.  Neck Neck- Supple. No Masses. Left sub mandibular node mild swollen and tender.   Chest and Lung Exam Auscultation: Breath Sounds:- even and unlabored  Cardiovascular Auscultation:Rythm- Regular, rate and rhythm. Murmurs & Other Heart Sounds:Ausculatation of the heart reveal- No Murmurs.  Lymphatic Head & Neck General Head & Neck Lymphatics: Bilateral: Description- No Localized lymphadenopathy.        Assessment & Plan:  For  Lt om and skin infection. Rx augmentin.  For the nasal congestion use atrovent nasal spray.  Your left neck lymph nodes  should resolve with augmentin.  Follow up 7 days or as needed

## 2015-07-22 ENCOUNTER — Telehealth: Payer: Self-pay | Admitting: Family

## 2015-07-22 MED ORDER — FLUCONAZOLE 150 MG PO TABS: ORAL_TABLET | ORAL | Status: DC

## 2015-07-22 NOTE — Telephone Encounter (Signed)
rx sent for diflucan

## 2015-07-22 NOTE — Telephone Encounter (Signed)
Notified pt. 

## 2015-07-22 NOTE — Telephone Encounter (Signed)
Pt was in and saw Percell Miller 07/10/15. She took augmentin and now has a yeast infection. Pt is breastfeeding and needs something that is safe. Uses [facility] [address].

## 2016-02-13 ENCOUNTER — Encounter: Payer: Self-pay | Admitting: Medical

## 2016-02-13 ENCOUNTER — Telehealth: Payer: Self-pay | Admitting: Family

## 2016-02-13 ENCOUNTER — Ambulatory Visit (INDEPENDENT_AMBULATORY_CARE_PROVIDER_SITE_OTHER): Payer: BLUE CROSS/BLUE SHIELD | Admitting: Medical

## 2016-02-13 VITALS — BP 120/78 | HR 67 | Temp 98.1°F | Ht 65.5 in | Wt 176.6 lb

## 2016-02-13 DIAGNOSIS — J209 Acute bronchitis, unspecified: Secondary | ICD-10-CM | POA: Diagnosis not present

## 2016-02-13 DIAGNOSIS — H6693 Otitis media, unspecified, bilateral: Secondary | ICD-10-CM | POA: Diagnosis not present

## 2016-02-13 MED ORDER — IPRATROPIUM BROMIDE 0.03 % NA SOLN: 2.0000 | Freq: Three times a day (TID) | NASAL | Status: DC

## 2016-02-13 MED ORDER — AMOXICILLIN-POT CLAVULANATE 875-125 MG PO TABS: 1.0000 | ORAL_TABLET | Freq: Two times a day (BID) | ORAL | Status: DC

## 2016-02-13 NOTE — Progress Notes (Signed)
Subjective:    Patient ID: [name], female    DOB: 1976/11/04, 40 y.o.   MRN: [id number]  HPI  Pt in with sinus congestion and cough for one week. Pt states now last 2 days bringing up mucous. Now with ear pain both sides. Worse rt side ear pain. Sneezing initially for first few days but none since. Pt had fever last night.Pt has faint achiness upper back. Not diffuse like the flu. But coughing a lot. Pt is a smoker.   Various members of family uri illness type but no flu dx.     Review of Systems  Constitutional: Positive for fever. Negative for chills and fatigue.  HENT: Positive for congestion, ear pain, postnasal drip and sinus pressure. Negative for sore throat.   Respiratory: Positive for cough. Negative for chest tightness, shortness of breath and wheezing.   Cardiovascular: Negative for chest pain and palpitations.  Gastrointestinal: Negative for abdominal pain.  Musculoskeletal: Negative for back pain.  Neurological: Negative for dizziness and headaches.  Hematological: Negative for adenopathy. Does not bruise/bleed easily.  Psychiatric/Behavioral: Negative for behavioral problems and confusion.    Past Medical History  Diagnosis Date  . Blood in stool     resolved  . Chicken pox   . Diverticulitis     pt states not sure if had diverticulitis - she was diagnosed with gallbladder problem  . Colon polyps   . Obesity     resolved - had gastric sleeve surgery  . Fractured toe 09/2013    left big toe about 3 weeks ago  . Seasonal allergies     Stuffy nose  . Hypertension     no problems since 06/2013   . 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 Headache(784.0)     migraines--none in past 20 yrs  . Anemia     after child birth - no problems since    Social History   Social History  . Marital Status: Married    Spouse Name: [name]  . Number of Children: [name]  . Years of Education: [name]   Occupational History  . Not on file.   Social History Main Topics  . Smoking  status: Former Smoker -- 0.25 packs/day for 2 years    Types: Cigarettes    Quit date: 06/11/1996  . Smokeless tobacco: Never Used  . Alcohol Use: No  . Drug Use: No  . Sexual Activity: Not on file   Other Topics Concern  . Not on file   Social History Narrative    Past Surgical History  Procedure Laterality Date  . Cesarean section  2006  . Laparoscopic cholecystectomy  2012  . Laparoscopic gastric sleeve resection [name] 06/19/2013    Procedure: LARAPROSCOPIC SLEEVE GASTRECTOMY WITH EGD;  Surgeon: Madilyn Hook, DO;  Location: [facility];  Service: General;  Laterality: [name];  LARAPROSCOPIC SLEEVE GASTRECTOMY WITH EGD   . Esophagogastroduodenoscopy [name] 06/19/2013    Procedure: ESOPHAGOGASTRODUODENOSCOPY (EGD);  Surgeon: Madilyn Hook, DO;  Location: [facility];  Service: General;  Laterality: [name];  . Ear cyst excision [name] 10/10/2013    Procedure:  REMOVAL Scalp masses;  Surgeon: Madilyn Hook, DO;  Location: [facility];  Service: General;  Laterality: [name];  . Mass excision Bilateral 02/28/2014    Procedure: EXCISION OF BILATERAL DEEP SUBCUTANEOUS BACK MASSES;  Surgeon: Adin Hector, MD;  Location: [facility];  Service: General;  Laterality: Bilateral;  . Mass excision Bilateral 05/23/2014    Procedure: EXCISION MASS OF LOWER BACK BILATERAL;  Surgeon:  Adin Hector, MD;  Location: [facility];  Service: General;  Laterality: Bilateral;    Family History  Problem Relation Age of Onset  . Stroke      family hx  . Crohn's disease Father   . Lupus Father   . Ulcerative colitis Father   . Cancer Other     breast    Allergies  Allergen Reactions  . Morphine And Related Nausea Only    With vomiting    Current Outpatient Prescriptions on File Prior to Visit  Medication Sig Dispense Refill  . Calcium 500 MG CHEW Chew 500 mg by mouth daily.     . Cyanocobalamin (VITAMIN B-12) 1000 MCG SUBL Take 1,000 mcg by mouth every morning.     . fluconazole (DIFLUCAN) 150 MG tablet Take 1 tab by mouth today,  repeat in 3 days if symptoms persist. 2 tablet 0   No current facility-administered medications on file prior to visit.    BP 120/78 mmHg  Pulse 67  Temp(Src) 98.1 F (36.7 C) (Oral)  Ht 5' 5.5" (1.664 m)  Wt 176 lb 9.6 oz (80.105 kg)  BMI 28.93 kg/m2  SpO2 98%  Breastfeeding? Yes       Objective:   Physical Exam  General  Mental Status - Alert. General Appearance - Well groomed. Not in acute distress.  Skin Rashes- No Rashes.  HEENT Head- Normal. Ear Auditory Canal - Left- Normal. Right - Normal.Tympanic Membrane- Left- bright red. Right- bright red  Normal. Eye Sclera/Conjunctiva- Left- Normal. Right- Normal. Nose & Sinuses Nasal Mucosa- Left-  Boggy and Congested. Right-  Boggy and  Congested.Bilateral maxillary and frontal sinus pressure. Mouth & Throat Lips: Upper Lip- Normal: no dryness, cracking, pallor, cyanosis, or vesicular eruption. Lower Lip-Normal: no dryness, cracking, pallor, cyanosis or vesicular eruption. Buccal Mucosa- Bilateral- No Aphthous ulcers. Oropharynx- No Discharge or Erythema. Tonsils: Characteristics- Bilateral- No Erythema or Congestion. Size/Enlargement- Bilateral- No enlargement. Discharge- bilateral-None.  Neck Neck- Supple. No Masses.   Chest and Lung Exam Auscultation: Breath Sounds:-Clear even and unlabored. Faint upper lobe rhonchi.  Cardiovascular Auscultation:Rythm- Regular, rate and rhythm. Murmurs & Other Heart Sounds:Ausculatation of the heart reveal- No Murmurs.  Lymphatic Head & Neck General Head & Neck Lymphatics: Bilateral: Description- No Localized lymphadenopathy.       Assessment & Plan:  For OM and bronchitis will augmentin and atrovent  nasal spray.  Rest hydrate and tylenol for fever.  If chest congestion worsens then get cxr.  Follow up in 7-10 days or as needed

## 2016-02-13 NOTE — Progress Notes (Signed)
Pre visit review using our clinic review tool, if applicable. No additional management support is needed unless otherwise documented below in the visit note. 

## 2016-02-13 NOTE — Telephone Encounter (Signed)
Spoke with pt and she was made aware that Rx is ready for pick up.

## 2016-02-13 NOTE — Telephone Encounter (Signed)
Relation to [id number] Call back number:[phone] Pharmacy: [place], [place] [address] [phone] (Phone) [phone] (Fax)         Reason for call:  Patient states augmentin was suppose to be sent in to pharmacy, patient would like a call when Rx is sent.

## 2016-02-13 NOTE — Patient Instructions (Addendum)
For OM and bronchitis will augmentin and atrovent nasal spray.(both meds cat b). I sent in flonase but don't fill since checked and cat C.  Rest hydrate and tylenol for fever.  If chest congestion worsens then get cxr.  Follow up in 7-10 days or as needed

## 2016-03-01 ENCOUNTER — Telehealth: Payer: Self-pay | Admitting: Family

## 2016-03-01 MED ORDER — FLUCONAZOLE 150 MG PO TABS: ORAL_TABLET | ORAL | Status: DC

## 2016-03-01 NOTE — Telephone Encounter (Signed)
rx sent for diflucan

## 2016-03-01 NOTE — Telephone Encounter (Signed)
Notified pt. 

## 2016-03-01 NOTE — Telephone Encounter (Signed)
Relation to [id number] Call back number:[phone] Pharmacy: [place], [place] [address] [phone] (Phone) [phone] (Fax)         Reason for call:  Patient states antibiotics caused her to have a yeast infection, requesting Rx

## 2016-09-01 ENCOUNTER — Encounter: Payer: Self-pay | Admitting: Family

## 2016-09-01 ENCOUNTER — Ambulatory Visit (HOSPITAL_BASED_OUTPATIENT_CLINIC_OR_DEPARTMENT_OTHER)
Admission: RE | Admit: 2016-09-01 | Discharge: 2016-09-01 | Disposition: A | Discharge status: Home / Self Care | Payer: BLUE CROSS/BLUE SHIELD | Source: Ambulatory Visit | Attending: Family | Admitting: Family

## 2016-09-01 ENCOUNTER — Ambulatory Visit (INDEPENDENT_AMBULATORY_CARE_PROVIDER_SITE_OTHER): Payer: BLUE CROSS/BLUE SHIELD | Admitting: Family

## 2016-09-01 VITALS — BP 145/88 | HR 91 | Temp 98.3°F | Resp 16 | Ht 65.5 in | Wt 177.8 lb

## 2016-09-01 DIAGNOSIS — M79601 Pain in right arm: Secondary | ICD-10-CM

## 2016-09-01 DIAGNOSIS — M79631 Pain in right forearm: Secondary | ICD-10-CM | POA: Diagnosis not present

## 2016-09-01 DIAGNOSIS — S59911A Unspecified injury of right forearm, initial encounter: Secondary | ICD-10-CM | POA: Diagnosis not present

## 2016-09-01 NOTE — Progress Notes (Signed)
Subjective:    Patient ID: [name], female    DOB: 09-Sep-1976, 40 y.o.   MRN: [id number]  HPI  [name] is a 40 yr old femlae who presents today with chief complaint of arm pain. She has been moving the past two weekend. Notes that she hit her right forearm on the door casing.  Since that time she feels weaker in that hand.     Review of Systems    see HPI   Past Medical History:  Diagnosis Date  . Anemia    after child birth - no problems since  . Blood in stool    resolved  . Chicken pox   . Colon polyps   . Diverticulitis    pt states not sure if had diverticulitis - she was diagnosed with gallbladder problem  . Fractured toe 09/2013   left big toe about 3 weeks ago  . Headache(784.0)    migraines--none in past 20 yrs  . Hypertension    no problems since 06/2013   . Obesity    resolved - had gastric sleeve surgery  . 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 Seasonal allergies    Stuffy nose     Social History   Social History  . Marital status: Married    Spouse name: [name]  . Number of children: [name]  . Years of education: [name]   Occupational History  . Not on file.   Social History Main Topics  . Smoking status: Former Smoker    Packs/day: 0.25    Years: 2.00    Types: Cigarettes    Quit date: 06/11/1996  . Smokeless tobacco: Never Used  . Alcohol use No  . Drug use: No  . Sexual activity: Not on file   Other Topics Concern  . Not on file   Social History Narrative  . No narrative on file    Past Surgical History:  Procedure Laterality Date  . CESAREAN SECTION  2006  . EAR CYST EXCISION [name] 10/10/2013   Procedure:  REMOVAL Scalp masses;  Surgeon: Madilyn Hook, DO;  Location: [facility];  Service: General;  Laterality: [name];  . ESOPHAGOGASTRODUODENOSCOPY [name] 06/19/2013   Procedure: ESOPHAGOGASTRODUODENOSCOPY (EGD);  Surgeon: Madilyn Hook, DO;  Location: [facility];  Service: General;  Laterality: [name];  . LAPAROSCOPIC CHOLECYSTECTOMY  2012  . LAPAROSCOPIC GASTRIC  SLEEVE RESECTION [name] 06/19/2013   Procedure: LARAPROSCOPIC SLEEVE GASTRECTOMY WITH EGD;  Surgeon: Madilyn Hook, DO;  [facility];  Service: General;  Laterality: [name];  LARAPROSCOPIC SLEEVE GASTRECTOMY WITH EGD   . MASS EXCISION Bilateral 02/28/2014   Procedure: EXCISION OF BILATERAL DEEP SUBCUTANEOUS BACK MASSES;  Surgeon: Adin Hector, MD;  Location: [facility];  Service: General;  Laterality: Bilateral;  . MASS EXCISION Bilateral 05/23/2014   Procedure: EXCISION MASS OF LOWER BACK BILATERAL;  Surgeon: Adin Hector, MD;  Location: [facility];  Service: General;  Laterality: Bilateral;    Family History  Problem Relation Age of Onset  . Stroke      family hx  . Crohn's disease Father   . Lupus Father   . Ulcerative colitis Father   . Cancer Other     breast    Allergies  Allergen Reactions  . Morphine And Related Nausea Only    With vomiting    Current Outpatient Prescriptions on File Prior to Visit  Medication Sig Dispense Refill  . Multiple Vitamins-Minerals (MULTIVITAMIN & MINERAL PO) Take 1 capsule by mouth.  No current facility-administered medications on file prior to visit.     BP (!) 145/88 (BP Location: Right Arm, Cuff Size: Normal)   Pulse 91   Temp 98.3 F (36.8 C) (Oral)   Resp 16   Ht 5' 5.5" (1.664 m)   Wt 177 lb 12.8 oz (80.6 kg)   LMP 08/11/2016   SpO2 97% Comment: room air  BMI 29.14 kg/m    Objective:   Physical Exam  Constitutional: She is oriented to person, place, and time. She appears well-developed and well-nourished.  Cardiovascular: Normal rate, regular rhythm and normal heart sounds.   No murmur heard. Pulmonary/Chest: Effort normal and breath sounds normal. No respiratory distress. She has no wheezes.  Musculoskeletal:  + pain to palpation at the base of the right lateral ulna  Neurological: She is alert and oriented to person, place, and time.  Psychiatric: She has a normal mood and affect. Her behavior is normal. Judgment and  thought content normal.          Assessment & Plan:  Right arm pain- X ray is performed and notes no fracture.  Likely bruised/strained.  Advised pt to use ibuprofen prn pain.

## 2016-09-01 NOTE — Patient Instructions (Signed)
You may use ibuprofen for pain. Please complete x ray on the first floor.

## 2016-09-01 NOTE — Progress Notes (Signed)
Pre visit review using our clinic review tool, if applicable. No additional management support is needed unless otherwise documented below in the visit note. 

## 2016-09-17 ENCOUNTER — Encounter (HOSPITAL_COMMUNITY): Payer: Self-pay

## 2016-11-29 ENCOUNTER — Telehealth: Payer: Self-pay | Admitting: Family

## 2016-11-29 ENCOUNTER — Encounter: Payer: Self-pay | Admitting: Medical

## 2016-11-29 ENCOUNTER — Ambulatory Visit (INDEPENDENT_AMBULATORY_CARE_PROVIDER_SITE_OTHER): Payer: BLUE CROSS/BLUE SHIELD | Admitting: Medical

## 2016-11-29 VITALS — BP 120/76 | HR 100 | Temp 98.1°F | Ht 65.5 in | Wt 175.6 lb

## 2016-11-29 DIAGNOSIS — R05 Cough: Secondary | ICD-10-CM | POA: Diagnosis not present

## 2016-11-29 DIAGNOSIS — R059 Cough, unspecified: Secondary | ICD-10-CM

## 2016-11-29 DIAGNOSIS — M791 Myalgia, unspecified site: Secondary | ICD-10-CM

## 2016-11-29 DIAGNOSIS — J111 Influenza due to unidentified influenza virus with other respiratory manifestations: Secondary | ICD-10-CM

## 2016-11-29 DIAGNOSIS — J0100 Acute maxillary sinusitis, unspecified: Secondary | ICD-10-CM | POA: Diagnosis not present

## 2016-11-29 LAB — POC INFLUENZA A&B (BINAX/QUICKVUE)
Influenza A, POC: POSITIVE — AB
Influenza B, POC: NEGATIVE

## 2016-11-29 MED ORDER — FLUTICASONE PROPIONATE 50 MCG/ACT NA SUSP: 2.0000 | Freq: Every day | NASAL | 1 refills | Status: DC

## 2016-11-29 MED ORDER — OSELTAMIVIR PHOSPHATE 75 MG PO CAPS: 75.0000 mg | ORAL_CAPSULE | Freq: Two times a day (BID) | ORAL | 0 refills | Status: DC

## 2016-11-29 MED ORDER — AMOXICILLIN-POT CLAVULANATE 875-125 MG PO TABS: 1.0000 | ORAL_TABLET | Freq: Two times a day (BID) | ORAL | 0 refills | Status: DC

## 2016-11-29 MED ORDER — HYDROCODONE-HOMATROPINE 5-1.5 MG/5ML PO SYRP: 5.0000 mL | ORAL_SOLUTION | Freq: Three times a day (TID) | ORAL | 0 refills | Status: DC | PRN

## 2016-11-29 MED ORDER — FLUCONAZOLE 150 MG PO TABS: ORAL_TABLET | ORAL | 1 refills | Status: DC

## 2016-11-29 NOTE — Telephone Encounter (Signed)
Diflucan sent to pt pharmacy.

## 2016-11-29 NOTE — Progress Notes (Signed)
Subjective:    Patient ID: [name], female    DOB: 1976/11/16, 40 y.o.   MRN: [id number]  HPI   Pt in sick for 3 weeks.   Pt states cough, congestion and runny nose for 2 weeks. Now productive cough since Friday. Pt states sinus pain and pressure. Runny nose. Sneezing.   No wheezing.  Pt states Saturday her back was achy with chills. Pt still achy today.  Pt tried ibuprofen and store brand mucinex.  LMP- one week ago.     Review of Systems  Constitutional: Positive for chills. Negative for fatigue and fever.  HENT: Positive for congestion, sinus pain, sinus pressure and sneezing. Negative for sore throat.   Respiratory: Positive for cough. Negative for shortness of breath and wheezing.   Cardiovascular: Negative for chest pain and palpitations.  Gastrointestinal: Negative for abdominal pain.  Musculoskeletal: Positive for myalgias. Negative for back pain.       Since Saturday.  Skin: Positive for rash.  Neurological: Negative for dizziness, seizures, weakness and headaches.  Hematological: Negative for adenopathy. Does not bruise/bleed easily.  Psychiatric/Behavioral: Negative for behavioral problems and confusion.    Past Medical History:  Diagnosis Date  . Anemia    after child birth - no problems since  . Blood in stool    resolved  . Chicken pox   . Colon polyps   . Diverticulitis    pt states not sure if had diverticulitis - she was diagnosed with gallbladder problem  . Fractured toe 09/2013   left big toe about 3 weeks ago  . Headache(784.0)    migraines--none in past 20 yrs  . Hypertension    no problems since 06/2013   . Obesity    resolved - had gastric sleeve surgery  . 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 Seasonal allergies    Stuffy nose     Social History   Social History  . Marital status: Married    Spouse name: [name]  . Number of children: [name]  . Years of education: [name]   Occupational History  . Not on file.   Social History Main Topics    . Smoking status: Former Smoker    Packs/day: 0.25    Years: 2.00    Types: Cigarettes    Quit date: 06/11/1996  . Smokeless tobacco: Never Used  . Alcohol use No  . Drug use: No  . Sexual activity: Not on file   Other Topics Concern  . Not on file   Social History Narrative  . No narrative on file    Past Surgical History:  Procedure Laterality Date  . CESAREAN SECTION  2006  . EAR CYST EXCISION [name] 10/10/2013   Procedure:  REMOVAL Scalp masses;  Surgeon: Madilyn Hook, DO;  Location: [facility];  Service: General;  Laterality: [name];  . ESOPHAGOGASTRODUODENOSCOPY [name] 06/19/2013   Procedure: ESOPHAGOGASTRODUODENOSCOPY (EGD);  Surgeon: Madilyn Hook, DO;  Location: [facility];  Service: General;  Laterality: [name];  . LAPAROSCOPIC CHOLECYSTECTOMY  2012  . LAPAROSCOPIC GASTRIC SLEEVE RESECTION [name] 06/19/2013   Procedure: LARAPROSCOPIC SLEEVE GASTRECTOMY WITH EGD;  Surgeon: Madilyn Hook, DO;  Location: [facility];  Service: General;  Laterality: [name];  LARAPROSCOPIC SLEEVE GASTRECTOMY WITH EGD   . MASS EXCISION Bilateral 02/28/2014   Procedure: EXCISION OF BILATERAL DEEP SUBCUTANEOUS BACK MASSES;  Surgeon: Adin Hector, MD;  Location: [facility];  Service: General;  Laterality: Bilateral;  . MASS EXCISION Bilateral 05/23/2014   Procedure: EXCISION MASS OF LOWER BACK  BILATERAL;  Surgeon: Adin Hector, MD;  Location: [facility];  Service: General;  Laterality: Bilateral;    Family History  Problem Relation Age of Onset  . Stroke      family hx  . Crohn's disease Father   . Lupus Father   . Ulcerative colitis Father   . Cancer Other     breast    Allergies  Allergen Reactions  . Morphine And Related Nausea Only    With vomiting    Current Outpatient Prescriptions on File Prior to Visit  Medication Sig Dispense Refill  . Multiple Vitamins-Minerals (MULTIVITAMIN & MINERAL PO) Take 1 capsule by mouth.     No current facility-administered medications on file prior to visit.     BP 120/76 (BP  Location: Left Arm, Patient Position: Sitting, Cuff Size: Normal)   Pulse 100   Temp 98.1 F (36.7 C) (Oral)   Ht 5' 5.5" (1.664 m)   Wt 175 lb 9.6 oz (79.7 kg)   SpO2 98%   BMI 28.78 kg/m       Objective:   Physical Exam  General  Mental Status - Alert. General Appearance - Well groomed. Not in acute distress.  Skin Rashes- No Rashes.  HEENT Head- Normal. Ear Auditory Canal - Left- Normal. Right - Normal.Tympanic Membrane- Left- Normal. Right- Normal. Eye Sclera/Conjunctiva- Left- Normal. Right- Normal. Nose & Sinuses Nasal Mucosa- Left-  Boggy and Congested. Right-  Boggy and  Congested.Bilateral maxillary and frontal sinus pressure. Mouth & Throat Lips: Upper Lip- Normal: no dryness, cracking, pallor, cyanosis, or vesicular eruption. Lower Lip-Normal: no dryness, cracking, pallor, cyanosis or vesicular eruption. Buccal Mucosa- Bilateral- No Aphthous ulcers. Oropharynx- No Discharge or Erythema. Tonsils: Characteristics- Bilateral- No Erythema or Congestion. Size/Enlargement- Bilateral- No enlargement. Discharge- bilateral-None.  Neck Neck- Supple. No Masses.   Chest and Lung Exam Auscultation: Breath Sounds:-Clear even and unlabored.  Cardiovascular Auscultation:Rythm- Regular, rate and rhythm. Murmurs & Other Heart Sounds:Ausculatation of the heart reveal- No Murmurs.  Lymphatic Head & Neck General Head & Neck Lymphatics: Bilateral: Description- No Localized lymphadenopathy.       Assessment & Plan:  You appear to have a sinus infection. I am prescribing augmentin antibiotic for the infection. To help with the nasal congestion I prescribed flonase  nasal steroid. For your associated cough, I prescribed cough medicine hycodan.  If you get chest congestion or more producitive cough then would recommend cxr.  Rest, hydrate, tylenol for fever.  We did a flu test due to muscle soreness. We will call you on the test results. If + then will rx tamiflu.  Pt  flu test was +. So notified of + result and sent in tamiflu.  Follow up in 7 days or as needed.   Carole Doner, Percell Miller, PA-C

## 2016-11-29 NOTE — Telephone Encounter (Signed)
Caller name: Relationship to patient: Self Can be reached: [phone]  Pharmacy:  [place], [place] [phone] ([phone] (Fax)     Reason for call: Patient states she needs a Rx for Diflucan because of the antibiotic she was given today.

## 2016-11-29 NOTE — Patient Instructions (Addendum)
You appear to have a sinus infection. I am prescribing augmentin antibiotic for the infection. To help with the nasal congestion I prescribed flonase  nasal steroid. For your associated cough, I prescribed cough medicine hycodan.  If you get chest congestion or more producitive cough then would recommend cxr.  Rest, hydrate, tylenol for fever.  We did a flu test due to muscle soreness. We will call you on the test results. If + then will rx tamiflu. Pt flu test was +. So notified of + result and sent in tamiflu.  Follow up in 7 days or as needed.

## 2016-11-29 NOTE — Progress Notes (Signed)
Pre visit review using our clinic review tool, if applicable. No additional management support is needed unless otherwise documented below in the visit note. 

## 2017-01-02 DIAGNOSIS — S5001XA Contusion of right elbow, initial encounter: Secondary | ICD-10-CM | POA: Diagnosis not present

## 2017-01-02 DIAGNOSIS — S29009A Unspecified injury of muscle and tendon of unspecified wall of thorax, initial encounter: Secondary | ICD-10-CM | POA: Diagnosis not present

## 2017-01-02 DIAGNOSIS — S300XXA Contusion of lower back and pelvis, initial encounter: Secondary | ICD-10-CM | POA: Diagnosis not present

## 2017-01-02 DIAGNOSIS — Z885 Allergy status to narcotic agent status: Secondary | ICD-10-CM | POA: Diagnosis not present

## 2017-01-02 DIAGNOSIS — S20219A Contusion of unspecified front wall of thorax, initial encounter: Secondary | ICD-10-CM | POA: Diagnosis not present

## 2017-01-02 DIAGNOSIS — S20212A Contusion of left front wall of thorax, initial encounter: Secondary | ICD-10-CM | POA: Diagnosis not present

## 2017-01-02 DIAGNOSIS — Z79899 Other long term (current) drug therapy: Secondary | ICD-10-CM | POA: Diagnosis not present

## 2017-01-02 DIAGNOSIS — Z87891 Personal history of nicotine dependence: Secondary | ICD-10-CM | POA: Diagnosis not present

## 2017-01-02 DIAGNOSIS — S40012A Contusion of left shoulder, initial encounter: Secondary | ICD-10-CM | POA: Diagnosis not present

## 2017-01-02 DIAGNOSIS — S3992XA Unspecified injury of lower back, initial encounter: Secondary | ICD-10-CM | POA: Diagnosis not present

## 2017-02-23 ENCOUNTER — Ambulatory Visit (INDEPENDENT_AMBULATORY_CARE_PROVIDER_SITE_OTHER): Payer: BLUE CROSS/BLUE SHIELD | Admitting: Family Medicine

## 2017-02-23 VITALS — BP 128/90 | HR 105 | Temp 99.2°F | Ht 65.5 in | Wt 174.4 lb

## 2017-02-23 DIAGNOSIS — J020 Streptococcal pharyngitis: Secondary | ICD-10-CM

## 2017-02-23 DIAGNOSIS — J029 Acute pharyngitis, unspecified: Secondary | ICD-10-CM

## 2017-02-23 DIAGNOSIS — R52 Pain, unspecified: Secondary | ICD-10-CM | POA: Diagnosis not present

## 2017-02-23 LAB — POCT INFLUENZA A/B
Influenza A, POC: NEGATIVE
Influenza B, POC: NEGATIVE

## 2017-02-23 LAB — POCT RAPID STREP A (OFFICE): Rapid Strep A Screen: POSITIVE — AB

## 2017-02-23 MED ORDER — PENICILLIN V POTASSIUM 500 MG PO TABS
500.0000 mg | ORAL_TABLET | Freq: Two times a day (BID) | ORAL | 0 refills | Status: DC
Start: 2017-02-23 — End: 2017-08-30

## 2017-02-23 NOTE — Patient Instructions (Signed)
You have strep throat.  Take the penicillin as directed for 10 days, rest, ibuprofen, tylenol as needed Please let me know if you are not feeling better in the next 48 hours!

## 2017-02-23 NOTE — Progress Notes (Signed)
[place] [facility] [address] [place], [place] [address] [phone]  Date:  02/23/2017   Name:  [name]   DOB:  11/13/1976   MRN:  [id number]  PCP:  Nance Pear., NP    Chief Complaint: Sore Throat (c/o sore throat that started yesterday. pt states that today she has body aches, chills, tongue feels like it's burning, nausea, loss of appetite. )   History of Present Illness:  [name] is a 41 y.o. very pleasant female patient who presents with the following:  She got sick yesterday with a ST- she otherwise felt ok yesterday, but today she noted that she felt cold and achy, wondered if she might have strep throat No known exposure to same She works from home  She noted a mild temp today Her ST is still present No GI symptoms except for some mild stomach cramps which may be her menses coming   She is drinking liquids but has not felt much like eating  She is not allergic to any abx LMP was 3 weeks ago  Patient Active Problem List   Diagnosis Date Noted  . Sore throat (viral) 01/02/2015  . Mass on back - 3x2x2cm 05/15/2014  . Sciatic pain - improving 03/27/2014  . Lipoma of lower back/flank, bilateral, s/p excision 02/24/2014  . 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 Sinusitis 11/12/2013  . Anxiety state, unspecified 05/22/2013  . Allergic rhinitis 02/07/2012  . Dyslipidemia 07/28/2011  . Hypertension 01/22/2011    Past Medical History:  Diagnosis Date  . Anemia    after child birth - no problems since  . Blood in stool    resolved  . Chicken pox   . Colon polyps   . Diverticulitis    pt states not sure if had diverticulitis - she was diagnosed with gallbladder problem  . Fractured toe 09/2013   left big toe about 3 weeks ago  . Headache(784.0)    migraines--none in past 20 yrs  . Hypertension    no problems since 06/2013   . Obesity    resolved - had gastric sleeve surgery  . Pilar cyst  12/10/2013  . Seasonal allergies    Stuffy nose    Past Surgical History:  Procedure Laterality Date  . CESAREAN SECTION  2006  . EAR CYST EXCISION [name] 10/10/2013   Procedure:  REMOVAL Scalp masses;  Surgeon: Madilyn Hook, DO;  Location: [facility];  Service: General;  Laterality: [name];  . ESOPHAGOGASTRODUODENOSCOPY [name] 06/19/2013   Procedure: ESOPHAGOGASTRODUODENOSCOPY (EGD);  Surgeon: Madilyn Hook, DO;  Location: [facility];  Service: General;  Laterality: [name];  . LAPAROSCOPIC CHOLECYSTECTOMY  2012  . LAPAROSCOPIC GASTRIC SLEEVE RESECTION [name] 06/19/2013   Procedure: LARAPROSCOPIC SLEEVE GASTRECTOMY WITH EGD;  Surgeon: Madilyn Hook, DO;  Location: [facility];  Service: General;  Laterality: [name];  LARAPROSCOPIC SLEEVE GASTRECTOMY WITH EGD   . MASS EXCISION Bilateral 02/28/2014   Procedure: EXCISION OF BILATERAL DEEP SUBCUTANEOUS BACK MASSES;  Surgeon: Adin Hector, MD;  Location: [facility];  Service: General;  Laterality: Bilateral;  . MASS EXCISION Bilateral 05/23/2014   Procedure: EXCISION MASS OF LOWER BACK BILATERAL;  Surgeon: Adin Hector, MD;  Location: [facility];  Service: General;  Laterality: Bilateral;    Social History  Substance Use Topics  . Smoking status: Former Smoker    Packs/day: 0.25    Years: 2.00    Types: Cigarettes    Quit date: 06/11/1996  .  Smokeless tobacco: Never Used  . Alcohol use No    Family History  Problem Relation Age of Onset  . Stroke      family hx  . Crohn's disease Father   . Lupus Father   . Ulcerative colitis Father   . Cancer Other     breast    Allergies  Allergen Reactions  . Morphine And Related Nausea Only    With vomiting    Medication list has been reviewed and updated.  Current Outpatient Prescriptions on File Prior to Visit  Medication Sig Dispense Refill  . Multiple Vitamins-Minerals (MULTIVITAMIN & MINERAL PO) Take 1 capsule by mouth.     No current facility-administered medications on file prior to visit.     Review of  Systems:  As per HPI- otherwise negative.   Physical Examination: Vitals:   02/23/17 1355 02/23/17 1421  BP: (!) 150/84 128/90  Pulse: (!) 105   Temp: 99.2 F (37.3 C)    Vitals:   02/23/17 1355  Weight: 174 lb 6.4 oz (79.1 kg)  Height: 5' 5.5" (1.664 m)   Body mass index is 28.58 kg/m. Ideal Body Weight: Weight in (lb) to have BMI = 25: 152.2  GEN: WDWN, NAD, Non-toxic, A & O x 3, looks well, normal weight HEENT: Atraumatic, Normocephalic. Neck supple. No masses, No LAD.  Bilateral TM wnl, oropharynx inflamed, no exudate.  PEERL,EOMI.   Ears and Nose: No external deformity. CV: RRR, No M/G/R. No JVD. No thrill. No extra heart sounds. PULM: CTA B, no wheezes, crackles, rhonchi. No retractions. No resp. distress. No accessory muscle use. ABD: S, NT, ND, +BS. No rebound. No HSM. EXTR: No c/c/e NEURO Normal gait.  PSYCH: Normally interactive. Conversant. Not depressed or anxious appearing.  Calm demeanor.   Results for orders placed or performed in visit on 02/23/17  POCT rapid strep A  Result Value Ref Range   Rapid Strep A Screen Positive (A) Negative  POCT Influenza A/B  Result Value Ref Range   Influenza A, POC Negative Negative   Influenza B, POC Negative Negative    Assessment and Plan: Strep pharyngitis - Plan: penicillin v potassium (VEETID) 500 MG tablet  Sore throat - Plan: POCT rapid strep A  Body aches - Plan: POCT Influenza A/B  Here today with strep throat. Flu test negative Will treat with penicillin, anytipyretics, follow-up if not improved within 48 hours    Signed Lamar Blinks, MD

## 2017-02-23 NOTE — Progress Notes (Signed)
Pre visit review using our clinic review tool, if applicable. No additional management support is needed unless otherwise documented below in the visit note. 

## 2017-02-28 ENCOUNTER — Telehealth: Payer: Self-pay | Admitting: Family

## 2017-02-28 MED ORDER — FLUCONAZOLE 150 MG PO TABS: 150.0000 mg | ORAL_TABLET | Freq: Once | ORAL | 0 refills | Status: AC

## 2017-02-28 MED ORDER — AMOXICILLIN 500 MG PO CAPS: 500.0000 mg | ORAL_CAPSULE | Freq: Three times a day (TID) | ORAL | 0 refills | Status: DC

## 2017-02-28 NOTE — Telephone Encounter (Signed)
Relation to EZ:BMZT Call back number:[phone] Pharmacy: [place], [place] [phone] ([phone] (Fax)     Reason for call:  patient last seen 02/23/17 for sore throat, patient states penicillin v potassium (VEETID) 500 MG tablet is not working and throat is still sore, patient requesting another Rx, patient states if "amoxicillin is prescribe it causes a yeast infection and please send antibiotics, please advise.

## 2017-02-28 NOTE — Telephone Encounter (Signed)
Per verbal from PCP, ok to send amoxicillin 500mg  three times a day for 6 days and diflucan 150mg  #1 tablet. If symptoms not improved in 2-3 days pt should be re-evaluated in the office. Notified pt and she voices understanding. Rxs sent.

## 2017-08-30 ENCOUNTER — Encounter: Payer: Self-pay | Admitting: Family

## 2017-08-30 ENCOUNTER — Ambulatory Visit (INDEPENDENT_AMBULATORY_CARE_PROVIDER_SITE_OTHER): Payer: BLUE CROSS/BLUE SHIELD | Admitting: Family

## 2017-08-30 VITALS — BP 136/92 | HR 69 | Temp 98.4°F | Resp 16 | Ht 65.5 in | Wt 170.6 lb

## 2017-08-30 DIAGNOSIS — M545 Low back pain, unspecified: Secondary | ICD-10-CM

## 2017-08-30 DIAGNOSIS — S93402A Sprain of unspecified ligament of left ankle, initial encounter: Secondary | ICD-10-CM

## 2017-08-30 MED ORDER — MELOXICAM 7.5 MG PO TABS: 7.5000 mg | ORAL_TABLET | Freq: Every day | ORAL | 0 refills | Status: DC

## 2017-08-30 NOTE — Patient Instructions (Addendum)
Start meloxicam for back/ankle pain.  Call if new/worsening symptoms or if not improved in 2-3 weeks.

## 2017-08-30 NOTE — Progress Notes (Signed)
Subjective:    Patient ID: [name], female    DOB: 1976/04/17, 42 y.o.   MRN: [id number]  HPI  [name] is a 41 yr old female who presents today with chief complaint of low back pain. Describes a stiffness. Back pain has been present x 6-8 weeks area is "concentrated to the lower mid back.  Reports that she has hx of lipoma removal but this was higher up on her back.  Finds that sitting down helps and she finds herself having to do this frequently.  Denies radicular pain, denies bowel/bladder incontinence.  Denies numbness/weakness of the LE's.  Hurt her left ankle [place] 7/31.  Still a bit sore.  Review of Systems See HPI  Past Medical History:  Diagnosis Date  . Anemia    after child birth - no problems since  . Blood in stool    resolved  . Chicken pox   . Colon polyps   . Diverticulitis    pt states not sure if had diverticulitis - she was diagnosed with gallbladder problem  . Fractured toe 09/2013   left big toe about 3 weeks ago  . Headache(784.0)    migraines--none in past 20 yrs  . Hypertension    no problems since 06/2013   . Obesity    resolved - had gastric sleeve surgery  . 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 Seasonal allergies    Stuffy nose     Social History   Social History  . Marital status: Married    Spouse name: [name]  . Number of children: [name]  . Years of education: [name]   Occupational History  . Not on file.   Social History Main Topics  . Smoking status: Former Smoker    Packs/day: 0.25    Years: 2.00    Types: Cigarettes    Quit date: 06/11/1996  . Smokeless tobacco: Never Used  . Alcohol use No  . Drug use: No  . Sexual activity: Not on file   Other Topics Concern  . Not on file   Social History Narrative  . No narrative on file    Past Surgical History:  Procedure Laterality Date  . CESAREAN SECTION  2006  . EAR CYST EXCISION [name] 10/10/2013   Procedure:  REMOVAL Scalp masses;  Surgeon: Madilyn Hook, DO;  Location: [facility];  Service: General;  Laterality: [name];  . ESOPHAGOGASTRODUODENOSCOPY [name] 06/19/2013   Procedure: ESOPHAGOGASTRODUODENOSCOPY (EGD);  Surgeon: Madilyn Hook, DO;  Location: [facility];  Service: General;  Laterality: [name];  . LAPAROSCOPIC CHOLECYSTECTOMY  2012  . LAPAROSCOPIC GASTRIC SLEEVE RESECTION [name] 06/19/2013   Procedure: LARAPROSCOPIC SLEEVE GASTRECTOMY WITH EGD;  Surgeon: Madilyn Hook, DO;  Location: [facility];  Service: General;  Laterality: [name];  LARAPROSCOPIC SLEEVE GASTRECTOMY WITH EGD   . MASS EXCISION Bilateral 02/28/2014   Procedure: EXCISION OF BILATERAL DEEP SUBCUTANEOUS BACK MASSES;  Surgeon: Adin Hector, MD;  Location: [facility];  Service: General;  Laterality: Bilateral;  . MASS EXCISION Bilateral 05/23/2014   Procedure: EXCISION MASS OF LOWER BACK BILATERAL;  Surgeon: Adin Hector, MD;  Location: [facility];  Service: General;  Laterality: Bilateral;    Family History  Problem Relation Age of Onset  . Stroke Unknown        family hx  . Crohn's disease Father   . Lupus Father   . Ulcerative colitis Father   . Cancer Other  breast    Allergies  Allergen Reactions  . Morphine And Related Nausea Only    With vomiting    Current Outpatient Prescriptions on File Prior to Visit  Medication Sig Dispense Refill  . Multiple Vitamins-Minerals (MULTIVITAMIN & MINERAL PO) Take 1 capsule by mouth.     No current facility-administered medications on file prior to visit.     BP (!) 136/92 (BP Location: Left Arm, Cuff Size: Normal)   Pulse 69   Temp 98.4 F (36.9 C) (Oral)   Resp 16   Ht 5' 5.5" (1.664 m)   Wt 170 lb 9.6 oz (77.4 kg)   LMP 08/13/2017   SpO2 100%   BMI 27.96 kg/m       Objective:   Physical Exam  Constitutional: She appears well-developed and well-nourished.  Cardiovascular: Normal rate, regular rhythm and normal heart sounds.   No murmur heard. Pulmonary/Chest: Effort normal and breath sounds normal. No respiratory  distress. She has no wheezes.  Musculoskeletal:       Cervical back: She exhibits no tenderness.       Thoracic back: She exhibits no tenderness.       Lumbar back: She exhibits no tenderness.  Left ankle without tenderness/swelling  Psychiatric: She has a normal mood and affect. Her behavior is normal. Judgment and thought content normal.          Assessment & Plan:  Declines flu shot  Low back pain- likely lumbar strain- rx with meloxicam. PT referral if no improvement with meloxicam.  Left ankle sprain- mild. Trial of meloxicam. Sports med referral if no improvement.

## 2018-01-26 ENCOUNTER — Encounter (HOSPITAL_COMMUNITY): Payer: Self-pay

## 2018-03-02 ENCOUNTER — Encounter: Payer: Self-pay | Admitting: Family Medicine

## 2018-03-02 ENCOUNTER — Ambulatory Visit: Payer: BLUE CROSS/BLUE SHIELD | Admitting: Family Medicine

## 2018-03-02 VITALS — BP 147/102 | HR 84 | Resp 16 | Ht 65.5 in | Wt 180.0 lb

## 2018-03-02 DIAGNOSIS — I10 Essential (primary) hypertension: Secondary | ICD-10-CM | POA: Diagnosis not present

## 2018-03-02 DIAGNOSIS — M62838 Other muscle spasm: Secondary | ICD-10-CM | POA: Diagnosis not present

## 2018-03-02 MED ORDER — CYCLOBENZAPRINE HCL 10 MG PO TABS: 10.0000 mg | ORAL_TABLET | Freq: Two times a day (BID) | ORAL | 0 refills | Status: AC | PRN

## 2018-03-02 NOTE — Patient Instructions (Signed)
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

## 2018-03-02 NOTE — Progress Notes (Signed)
[facility] [place], [place],  [address] [phone] [id number]  Date:  03/02/2018   Name:  [name]   DOB:  [birth year]   MRN:  [id number]  PCP:  Rachel Alar, NP    Chief Complaint: Back Pain (upper back/shoulders, tense muscle, tens unit and massage not helping, no known injury off and on)   History of Present Illness:  [name] is a 42 y.o. very pleasant female patient who presents with the following:  Pt of [name] here today with concern of back pain I saw here a year ago for ST, and she was here in September with a lumbar strain  She has had back trouble in the past, and this seems much like her typical sx She notes tight muscle knots in her back, "under the shoulder blade" She has tried tens, massage, ibuprofen, ice- as these measures are not helping she decided to come in She may feel ok after she uses her treatment modalities, but still wakes up very stiff in the morning  This problem came back about 2- 3 months ago She has noted this issue intermittently over the last couple of years  She tried buying a new pillow but this did not help  She is generally in good health except for benign lipomas that she had removed in the pst   She has a BTL after her son was born- he is nearly 25 yo  Allergic to morphine but otherwise no drug allergies Noted that her BP is high today- pt feels this is due to pain  BP Readings from Last 3 Encounters:  03/02/18 (!) 147/102  08/30/17 (!) 136/92  02/23/17 128/90     Patient Active Problem List   Diagnosis Date Noted  . Mass on back - 3x2x2cm 05/15/2014  . Sciatic pain - improving 03/27/2014  . Lipoma of lower back/flank, bilateral, s/p excision 02/24/2014  . 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 Anxiety state, unspecified 05/22/2013  . Allergic rhinitis 02/07/2012  . Dyslipidemia 07/28/2011  . Hypertension 01/22/2011    Past Medical History:  Diagnosis  Date  . Anemia    after child birth - no problems since  . Blood in stool    resolved  . Chicken pox   . Colon polyps   . Diverticulitis    pt states not sure if had diverticulitis - she was diagnosed with gallbladder problem  . Fractured toe 09/2013   left big toe about 3 weeks ago  . Headache(784.0)    migraines--none in past 20 yrs  . Hypertension    no problems since 06/2013   . Obesity    resolved - had gastric sleeve surgery  . 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 Seasonal allergies    Stuffy nose    Past Surgical History:  Procedure Laterality Date  . CESAREAN SECTION  2006  . EAR CYST EXCISION [name] 10/10/2013   Procedure:  REMOVAL Scalp masses;  Surgeon: Madilyn Hook, DO;  Location: [facility];  Service: General;  Laterality: [name];  . ESOPHAGOGASTRODUODENOSCOPY [name] 06/19/2013   Procedure: ESOPHAGOGASTRODUODENOSCOPY (EGD);  Surgeon: Madilyn Hook, DO;  Location: [facility];  Service: General;  Laterality: [name];  . LAPAROSCOPIC CHOLECYSTECTOMY  2012  . LAPAROSCOPIC GASTRIC SLEEVE RESECTION [name] 06/19/2013   Procedure: LARAPROSCOPIC SLEEVE GASTRECTOMY WITH EGD;  Surgeon: Madilyn Hook, DO;  Location: [facility];  Service: General;  Laterality: [name];  LARAPROSCOPIC SLEEVE GASTRECTOMY WITH EGD   . MASS  EXCISION Bilateral 02/28/2014   Procedure: EXCISION OF BILATERAL DEEP SUBCUTANEOUS BACK MASSES;  Surgeon: Adin Hector, MD;  Location: [facility];  Service: General;  Laterality: Bilateral;  . MASS EXCISION Bilateral 05/23/2014   Procedure: EXCISION MASS OF LOWER BACK BILATERAL;  Surgeon: Adin Hector, MD;  Location: [facility];  Service: General;  Laterality: Bilateral;    Social History   Tobacco Use  . Smoking status: Former Smoker    Packs/day: 0.25    Years: 2.00    Pack years: 0.50    Types: Cigarettes    Last attempt to quit: 06/11/1996    Years since quitting: 21.7  . Smokeless tobacco: Never Used  Substance Use Topics  . Alcohol use: No  . Drug use: No    Family History  Problem Relation Age  of Onset  . Stroke Unknown        family hx  . Crohn's disease Father   . Lupus Father   . Ulcerative colitis Father   . Cancer Other        breast    Allergies  Allergen Reactions  . Morphine And Related Nausea Only    With vomiting    Medication list has been reviewed and updated.  Current Outpatient Medications on File Prior to Visit  Medication Sig Dispense Refill  . Multiple Vitamins-Minerals (MULTIVITAMIN & MINERAL PO) Take 1 capsule by mouth.     No current facility-administered medications on file prior to visit.     Review of Systems:  As per HPI- otherwise negative.   Physical Examination: Vitals:   03/02/18 1533  BP: (!) 147/102  Pulse: 84  Resp: 16  SpO2: 99%   Vitals:   03/02/18 1533  Weight: 180 lb (81.6 kg)  Height: 5' 5.5" (1.664 m)   Body mass index is 29.5 kg/m. Ideal Body Weight: Weight in (lb) to have BMI = 25: 152.2  GEN: WDWN, NAD, Non-toxic, A & O x 3, looks well, overweight HEENT: Atraumatic, Normocephalic. Neck supple. No masses, No LAD.  Bilateral TM wnl, oropharynx normal.  PEERL,EOMI.   Ears and Nose: No external deformity. CV: RRR, No M/G/R. No JVD. No thrill. No extra heart sounds. PULM: CTA B, no wheezes, crackles, rhonchi. No retractions. No resp. distress. No accessory muscle use. EXTR: No c/c/e NEURO Normal gait.  Normal strength and DTR of all extremities PSYCH: Normally interactive. Conversant. Not depressed or anxious appearing.  Calm demeanor.  She notes tenderness of her trapezius muscles and they do exhibit spasm.  She has discomfort rotating her head left, ok to the right No meningismus No redness or skin lesion of the neck or back   My CMA repeated her BP- about the same, a few points lower only  Assessment and Plan: Trapezius muscle spasm - Plan: cyclobenzaprine (FLEXERIL) 10 MG tablet  Essential hypertension  Here today with recurrent trapezius strain Will use flexeril as needed, may also continue ice,  tens, etc She will contact me if not helpful  Her BP remained high on recheck, she is not currently on any meds and feels elevation due to pain- mychart message to pt ZW:CHENID-PO  Hi [name], I hope that your neck and back are soon feeling much better!  Your BP did come down a bit on recheck, but was still high today.  I would encourage you to check your BP a few times at home/ drug store over the next month or so. If you tend to run higher than 130/90  on a routine basis please come and see [place] to discuss Take care!   Signed Lamar Blinks, MD

## 2018-04-14 DIAGNOSIS — H6691 Otitis media, unspecified, right ear: Secondary | ICD-10-CM | POA: Diagnosis not present

## 2018-09-08 ENCOUNTER — Telehealth: Payer: Self-pay | Admitting: Family

## 2018-09-08 DIAGNOSIS — J019 Acute sinusitis, unspecified: Secondary | ICD-10-CM | POA: Diagnosis not present

## 2018-09-08 DIAGNOSIS — B9689 Other specified bacterial agents as the cause of diseases classified elsewhere: Secondary | ICD-10-CM | POA: Diagnosis not present

## 2018-09-08 NOTE — Telephone Encounter (Signed)
Copied from [place] [id number]. Topic: Quick Communication - See Telephone Encounter >> Sep 08, 2018 12:39 PM Blase Mess A wrote: CRM for notification. See Telephone encounter for: 09/08/18. Patient is requesting Medical records to be sent to [place] [id number] [phone]

## 2018-09-12 NOTE — Telephone Encounter (Signed)
Patient advised by Laverna Peace, she need to sign a release form.

## 2018-11-17 DIAGNOSIS — Z1239 Encounter for other screening for malignant neoplasm of breast: Secondary | ICD-10-CM | POA: Diagnosis not present

## 2018-11-17 DIAGNOSIS — Z9884 Bariatric surgery status: Secondary | ICD-10-CM | POA: Diagnosis not present

## 2018-11-17 DIAGNOSIS — R635 Abnormal weight gain: Secondary | ICD-10-CM | POA: Diagnosis not present

## 2018-11-17 DIAGNOSIS — Z0000 Encounter for general adult medical examination without abnormal findings: Secondary | ICD-10-CM | POA: Diagnosis not present

## 2018-11-18 DIAGNOSIS — Z9884 Bariatric surgery status: Secondary | ICD-10-CM | POA: Diagnosis not present

## 2018-11-18 DIAGNOSIS — Z0000 Encounter for general adult medical examination without abnormal findings: Secondary | ICD-10-CM | POA: Diagnosis not present

## 2018-11-18 DIAGNOSIS — R635 Abnormal weight gain: Secondary | ICD-10-CM | POA: Diagnosis not present

## 2018-11-18 DIAGNOSIS — Z13220 Encounter for screening for lipoid disorders: Secondary | ICD-10-CM | POA: Diagnosis not present

## 2019-01-01 DIAGNOSIS — Z1231 Encounter for screening mammogram for malignant neoplasm of breast: Secondary | ICD-10-CM | POA: Diagnosis not present

## 2019-01-01 DIAGNOSIS — Z1239 Encounter for other screening for malignant neoplasm of breast: Secondary | ICD-10-CM | POA: Diagnosis not present

## 2019-02-20 DIAGNOSIS — L720 Epidermal cyst: Secondary | ICD-10-CM | POA: Diagnosis not present

## 2019-04-26 DIAGNOSIS — L723 Sebaceous cyst: Secondary | ICD-10-CM | POA: Diagnosis not present

## 2019-04-26 DIAGNOSIS — L089 Local infection of the skin and subcutaneous tissue, unspecified: Secondary | ICD-10-CM | POA: Diagnosis not present

## 2019-10-05 DIAGNOSIS — R109 Unspecified abdominal pain: Secondary | ICD-10-CM | POA: Diagnosis not present

## 2019-10-05 DIAGNOSIS — R2243 Localized swelling, mass and lump, lower limb, bilateral: Secondary | ICD-10-CM | POA: Diagnosis not present

## 2019-10-05 DIAGNOSIS — R6889 Other general symptoms and signs: Secondary | ICD-10-CM | POA: Diagnosis not present

## 2019-10-05 DIAGNOSIS — M25641 Stiffness of right hand, not elsewhere classified: Secondary | ICD-10-CM | POA: Diagnosis not present

## 2019-10-05 DIAGNOSIS — M25642 Stiffness of left hand, not elsewhere classified: Secondary | ICD-10-CM | POA: Diagnosis not present

## 2019-10-09 DIAGNOSIS — R109 Unspecified abdominal pain: Secondary | ICD-10-CM | POA: Diagnosis not present

## 2019-10-09 DIAGNOSIS — Z9049 Acquired absence of other specified parts of digestive tract: Secondary | ICD-10-CM | POA: Diagnosis not present

## 2019-10-09 DIAGNOSIS — K760 Fatty (change of) liver, not elsewhere classified: Secondary | ICD-10-CM | POA: Diagnosis not present

## 2019-10-10 DIAGNOSIS — R232 Flushing: Secondary | ICD-10-CM | POA: Diagnosis not present

## 2019-10-23 DIAGNOSIS — R635 Abnormal weight gain: Secondary | ICD-10-CM | POA: Diagnosis not present

## 2019-10-23 DIAGNOSIS — R5383 Other fatigue: Secondary | ICD-10-CM | POA: Diagnosis not present

## 2019-10-29 DIAGNOSIS — R5383 Other fatigue: Secondary | ICD-10-CM | POA: Diagnosis not present

## 2019-10-29 DIAGNOSIS — R635 Abnormal weight gain: Secondary | ICD-10-CM | POA: Diagnosis not present

## 2019-12-12 DIAGNOSIS — Z01419 Encounter for gynecological examination (general) (routine) without abnormal findings: Secondary | ICD-10-CM | POA: Diagnosis not present

## 2019-12-12 DIAGNOSIS — E559 Vitamin D deficiency, unspecified: Secondary | ICD-10-CM | POA: Diagnosis not present

## 2019-12-12 DIAGNOSIS — E782 Mixed hyperlipidemia: Secondary | ICD-10-CM | POA: Diagnosis not present

## 2019-12-12 DIAGNOSIS — Z79899 Other long term (current) drug therapy: Secondary | ICD-10-CM | POA: Diagnosis not present

## 2019-12-12 DIAGNOSIS — Z1239 Encounter for other screening for malignant neoplasm of breast: Secondary | ICD-10-CM | POA: Diagnosis not present

## 2019-12-12 DIAGNOSIS — Z0000 Encounter for general adult medical examination without abnormal findings: Secondary | ICD-10-CM | POA: Diagnosis not present

## 2019-12-12 DIAGNOSIS — R5383 Other fatigue: Secondary | ICD-10-CM | POA: Diagnosis not present
